# Patient Record
Sex: Male | Born: 1965 | Race: Black or African American | Hispanic: No | Marital: Married | Smoking: Current every day smoker
Health system: Southern US, Community
[De-identification: ages and names within clinical notes are randomized; demographics above are authoritative.]

## PROBLEM LIST (undated history)

## (undated) DIAGNOSIS — E7800 Pure hypercholesterolemia, unspecified: Secondary | ICD-10-CM

## (undated) DIAGNOSIS — E119 Type 2 diabetes mellitus without complications: Secondary | ICD-10-CM

## (undated) DIAGNOSIS — F319 Bipolar disorder, unspecified: Secondary | ICD-10-CM

## (undated) DIAGNOSIS — I10 Essential (primary) hypertension: Secondary | ICD-10-CM

## (undated) DIAGNOSIS — F32A Depression, unspecified: Secondary | ICD-10-CM

## (undated) HISTORY — PX: TOTAL HIP ARTHROPLASTY: SHX124

## (undated) HISTORY — DX: Essential (primary) hypertension: I10

## (undated) HISTORY — DX: Depression, unspecified: F32.A

## (undated) HISTORY — DX: Type 2 diabetes mellitus without complications: E11.9

---

## 2012-09-04 ENCOUNTER — Encounter (HOSPITAL_COMMUNITY): Payer: Self-pay | Admitting: Emergency Medicine

## 2012-09-04 ENCOUNTER — Emergency Department (INDEPENDENT_AMBULATORY_CARE_PROVIDER_SITE_OTHER)
Admission: EM | Admit: 2012-09-04 | Discharge: 2012-09-04 | Disposition: A | Discharge status: Home / Self Care | Payer: Self-pay | Source: Home / Self Care | Attending: Family Medicine | Admitting: Family Medicine

## 2012-09-04 DIAGNOSIS — Z760 Encounter for issue of repeat prescription: Secondary | ICD-10-CM

## 2012-09-04 DIAGNOSIS — E119 Type 2 diabetes mellitus without complications: Secondary | ICD-10-CM

## 2012-09-04 LAB — POCT I-STAT, CHEM 8
BUN: 9 mg/dL (ref 6–23)
Chloride: 103 mEq/L (ref 96–112)
Creatinine, Ser: 0.8 mg/dL (ref 0.50–1.35)
Glucose, Bld: 272 mg/dL — ABNORMAL HIGH (ref 70–99)
Potassium: 4.1 mEq/L (ref 3.5–5.1)
Sodium: 141 mEq/L (ref 135–145)

## 2012-09-04 MED ORDER — METFORMIN HCL 500 MG PO TABS: 500.0000 mg | ORAL_TABLET | Freq: Two times a day (BID) | ORAL | Status: DC

## 2012-09-04 MED ORDER — INSULIN LISPRO 100 UNIT/ML ~~LOC~~ SOLN: SUBCUTANEOUS | Status: DC

## 2012-09-04 MED ORDER — INSULIN GLARGINE 100 UNIT/ML ~~LOC~~ SOLN: 30.0000 [IU] | Freq: Every day | SUBCUTANEOUS | Status: DC

## 2012-09-04 NOTE — ED Notes (Signed)
Patient is here medication refill, Humalog and lantus.

## 2012-09-04 NOTE — ED Provider Notes (Signed)
History    CSN: [id number] Arrival date & time 09/04/12  1029  First MD Initiated Contact with Patient 09/04/12 1103     Chief Complaint  Patient presents with  . Medication Refill   (Consider location/radiation/quality/duration/timing/severity/associated sxs/prior Treatment) HPI Comments: 47 year old male diagnosed with type 2 diabetes 5 years ago. Comments requesting refills on his Lantus and Humalog insulin. Patient states he has been off his medications for 2 months. He reports increased thirst and polydipsia and polyuria. Otherwise denies current dizziness, chest pain, abdominal pain, nausea vomiting or diarrhea. Patient stated he used to live in [place] and had enough refills of his diabetes medications 1-2 months. He does not have insurance and does not have a primary care provider in [place] where he has lived for the past year. Denies foot ulcers or skin breaks or infections.  History reviewed. No pertinent past medical history. No past surgical history on file. No family history on file. History  Substance Use Topics  . Smoking status: Not on file  . Smokeless tobacco: Not on file  . Alcohol Use: Not on file    Review of Systems  Constitutional: Negative for fever, chills, diaphoresis, activity change, appetite change and fatigue.  HENT: Negative for sore throat and trouble swallowing.   Gastrointestinal: Negative for nausea, vomiting, abdominal pain and diarrhea.  Endocrine: Positive for polydipsia and polyuria. Negative for cold intolerance, heat intolerance and polyphagia.  Genitourinary: Negative for dysuria, frequency and flank pain.  Skin: Negative for wound.  Neurological: Negative for dizziness and headaches.  All other systems reviewed and are negative.    Allergies  Review of patient's allergies indicates no known allergies.  Home Medications   Current Outpatient Rx  Name  Route  Sig  Dispense  Refill  . insulin glargine (LANTUS) 100 UNIT/ML  injection   Subcutaneous   Inject 30 Units into the skin at bedtime.         . insulin lispro (HUMALOG) 100 UNIT/ML injection   Subcutaneous   Inject into the skin 3 (three) times daily before meals.          BP 129/93  Pulse 70  Temp(Src) 98.2 F (36.8 C) (Oral)  Resp 18  SpO2 97% Physical Exam  Nursing note and vitals reviewed. Constitutional: He is oriented to person, place, and time. He appears well-developed and well-nourished. No distress.  HENT:  Head: Normocephalic and atraumatic.  Mouth/Throat: Oropharynx is clear and moist. No oropharyngeal exudate.  Eyes: Conjunctivae are normal. Right eye exhibits no discharge. Left eye exhibits no discharge. No scleral icterus.  Neck: Neck supple. No JVD present. No thyromegaly present.  Cardiovascular: Normal heart sounds.   Pulmonary/Chest: Breath sounds normal.  Abdominal: Soft. Bowel sounds are normal. He exhibits no distension and no mass. There is no tenderness. There is no rebound and no guarding.  Lymphadenopathy:    He has no cervical adenopathy.  Neurological: He is alert and oriented to person, place, and time.  Skin: No rash noted. He is not diaphoretic.  Psychiatric: He has a normal mood and affect. His behavior is normal. Judgment and thought content normal.    ED Course  Procedures (including critical care time) Labs Reviewed  POCT I-STAT, CHEM 8 - Abnormal; Notable for the following:    Glucose, Bld 272 (*)    All other components within normal limits   No results found. 1. Diabetes   2. Medication refill     MDM  Point-of-care CB[facility] to 272 here. Patient  vital signs stable and he is is asymptomatic. Refilled Lantus and Humalog. Also prescribed metformin. Provided contact information for: [facility] and [employer] and other community resources to establish primary care. Supportive care and red flags that should prompt his return to medical attention discussed with patient and provided in  writing.  Sharin Grave, MD 09/06/12 [phone]

## 2013-01-26 ENCOUNTER — Emergency Department (HOSPITAL_COMMUNITY)
Admission: EM | Admit: 2013-01-26 | Discharge: 2013-01-26 | Disposition: A | Discharge status: Home / Self Care | Payer: Medicaid Other | Attending: Emergency Medicine | Admitting: Emergency Medicine

## 2013-01-26 ENCOUNTER — Encounter (HOSPITAL_COMMUNITY): Payer: Self-pay | Admitting: Emergency Medicine

## 2013-01-26 DIAGNOSIS — F1410 Cocaine abuse, uncomplicated: Secondary | ICD-10-CM | POA: Insufficient documentation

## 2013-01-26 DIAGNOSIS — F17200 Nicotine dependence, unspecified, uncomplicated: Secondary | ICD-10-CM | POA: Insufficient documentation

## 2013-01-26 DIAGNOSIS — R079 Chest pain, unspecified: Secondary | ICD-10-CM | POA: Insufficient documentation

## 2013-01-26 DIAGNOSIS — Z91199 Patient's noncompliance with other medical treatment and regimen due to unspecified reason: Secondary | ICD-10-CM | POA: Insufficient documentation

## 2013-01-26 DIAGNOSIS — F1910 Other psychoactive substance abuse, uncomplicated: Secondary | ICD-10-CM

## 2013-01-26 DIAGNOSIS — Z8659 Personal history of other mental and behavioral disorders: Secondary | ICD-10-CM | POA: Insufficient documentation

## 2013-01-26 DIAGNOSIS — F1010 Alcohol abuse, uncomplicated: Secondary | ICD-10-CM | POA: Insufficient documentation

## 2013-01-26 DIAGNOSIS — Z79899 Other long term (current) drug therapy: Secondary | ICD-10-CM | POA: Insufficient documentation

## 2013-01-26 DIAGNOSIS — E119 Type 2 diabetes mellitus without complications: Secondary | ICD-10-CM | POA: Insufficient documentation

## 2013-01-26 DIAGNOSIS — Z9119 Patient's noncompliance with other medical treatment and regimen: Secondary | ICD-10-CM | POA: Insufficient documentation

## 2013-01-26 DIAGNOSIS — Z9114 Patient's other noncompliance with medication regimen: Secondary | ICD-10-CM

## 2013-01-26 HISTORY — DX: Type 2 diabetes mellitus without complications: E11.9

## 2013-01-26 HISTORY — DX: Bipolar disorder, unspecified: F31.9

## 2013-01-26 HISTORY — DX: Pure hypercholesterolemia, unspecified: E78.00

## 2013-01-26 LAB — SALICYLATE LEVEL: Salicylate Lvl: <2 mg/dL — ABNORMAL LOW (ref 2.8–20.0)

## 2013-01-26 LAB — COMPREHENSIVE METABOLIC PANEL
ALT: 97 U/L — ABNORMAL HIGH (ref 0–53)
AST: 64 U/L — ABNORMAL HIGH (ref 0–37)
CO2: 22 mEq/L (ref 19–32)
Calcium: 9.1 mg/dL (ref 8.4–10.5)
Chloride: 99 mEq/L (ref 96–112)
Creatinine, Ser: 0.72 mg/dL (ref 0.50–1.35)
GFR calc Af Amer: >90 mL/min (ref >90)
GFR calc non Af Amer: >90 mL/min (ref >90)
Glucose, Bld: 149 mg/dL — ABNORMAL HIGH (ref 70–99)
Total Bilirubin: 0.7 mg/dL (ref 0.3–1.2)

## 2013-01-26 LAB — CBC
HCT: 44.2 % (ref 39.0–52.0)
Hemoglobin: 16 g/dL (ref 13.0–17.0)
MCH: 31.9 pg (ref 26.0–34.0)
MCV: 88.2 fL (ref 78.0–100.0)
RBC: 5.01 MIL/uL (ref 4.22–5.81)
WBC: 12 10*3/uL — ABNORMAL HIGH (ref 4.0–10.5)

## 2013-01-26 LAB — RAPID URINE DRUG SCREEN, HOSP PERFORMED
Amphetamines: NOT DETECTED
Opiates: NOT DETECTED
Tetrahydrocannabinol: NOT DETECTED

## 2013-01-26 MED ORDER — METFORMIN HCL 500 MG PO TABS: 500.0000 mg | ORAL_TABLET | Freq: Two times a day (BID) | ORAL | Status: DC

## 2013-01-26 MED ORDER — LISINOPRIL-HYDROCHLOROTHIAZIDE 10-12.5 MG PO TABS: 1.0000 | ORAL_TABLET | Freq: Every day | ORAL | Status: DC

## 2013-01-26 NOTE — ED Provider Notes (Signed)
CSN: [id number]     Arrival date & time 01/26/13  1702 History   First MD Initiated Contact with Patient 01/26/13 1940     Chief Complaint  Patient presents with  . Medical Clearance   (Consider location/radiation/quality/duration/timing/severity/associated sxs/prior Treatment) The history is provided by the patient.   patient presents to the ED after relapsing on cocaine and alcohol. He had been clean for 6 years. For the last 2 days is used around $600 of cocaine and drank 2 cases of beer. He states his mouth feels dry he feels somewhat bad all over. No chest pain, although he states he has had occasional episodes of chest pain over the last few months. No fevers. No cough. No nausea or diarrhea. No suicidal or homicidal thoughts. He states his been off his medications for a couple months due to financial issues.  Past Medical History  Diagnosis Date  . Diabetes mellitus without complication   . Hypercholesteremia   . Manic depression    Past Surgical History  Procedure Laterality Date  . Total hip arthroplasty     History reviewed. No pertinent family history. History  Substance Use Topics  . Smoking status: Current Every Day Smoker    Types: Cigarettes  . Smokeless tobacco: Not on file  . Alcohol Use: Yes    Review of Systems  Constitutional: Negative for activity change and appetite change.  Eyes: Negative for pain.  Respiratory: Negative for chest tightness and shortness of breath.   Cardiovascular: Positive for chest pain. Negative for leg swelling.  Gastrointestinal: Negative for nausea, vomiting, abdominal pain and diarrhea.  Genitourinary: Negative for flank pain.  Musculoskeletal: Negative for back pain and neck stiffness.  Skin: Negative for rash.  Neurological: Negative for weakness, numbness and headaches.  Psychiatric/Behavioral: Negative for behavioral problems.    Allergies  Review of patient's allergies indicates no known allergies.  Home Medications    Current Outpatient Rx  Name  Route  Sig  Dispense  Refill  . metFORMIN (GLUCOPHAGE) 500 MG tablet   Oral   Take 500 mg by mouth 2 (two) times daily with a meal.         . lisinopril-hydrochlorothiazide (PRINZIDE,ZESTORETIC) 10-12.5 MG per tablet   Oral   Take 1 tablet by mouth daily.   30 tablet   0   . metFORMIN (GLUCOPHAGE) 500 MG tablet   Oral   Take 1 tablet (500 mg total) by mouth 2 (two) times daily with a meal.   60 tablet   0    BP 149/105  Pulse 88  Temp(Src) 98.6 F (37 C) (Oral)  Resp 13  Wt 249 lb 12.8 oz (113.309 kg)  SpO2 98% Physical Exam  Nursing note and vitals reviewed. Constitutional: He is oriented to person, place, and time. He appears well-developed and well-nourished.  HENT:  Head: Normocephalic and atraumatic.  Eyes: EOM are normal. Pupils are equal, round, and reactive to light.  Neck: Normal range of motion. Neck supple.  Cardiovascular: Normal rate, regular rhythm and normal heart sounds.   No murmur heard. Pulmonary/Chest: Effort normal and breath sounds normal.  Abdominal: Soft. Bowel sounds are normal. He exhibits no distension and no mass. There is no tenderness. There is no rebound and no guarding.  Musculoskeletal: Normal range of motion. He exhibits no edema.  Neurological: He is alert and oriented to person, place, and time. No cranial nerve deficit.  Skin: Skin is warm and dry.  Psychiatric: He has a normal mood and  affect.    ED Course  Procedures (including critical care time) Labs Review Labs Reviewed  CBC - Abnormal; Notable for the following:    WBC 12.0 (*)    MCHC 36.2 (*)    All other components within normal limits  COMPREHENSIVE METABOLIC PANEL - Abnormal; Notable for the following:    Sodium 134 (*)    Glucose, Bld 149 (*)    Total Protein 8.4 (*)    AST 64 (*)    ALT 97 (*)    All other components within normal limits  SALICYLATE LEVEL - Abnormal; Notable for the following:    Salicylate Lvl <2.0 (*)     All other components within normal limits  URINE RAPID DRUG SCREEN (HOSP PERFORMED) - Abnormal; Notable for the following:    Cocaine POSITIVE (*)    All other components within normal limits  ACETAMINOPHEN LEVEL  ETHANOL   Imaging Review No results found.  EKG Interpretation    Date/Time:  Sunday January 26 2013 20:19:17 EST Ventricular Rate:  85 PR Interval:  160 QRS Duration: 74 QT Interval:  364 QTC Calculation: 433 R Axis:   41 Text Interpretation:  Sinus rhythm Abnormal R-wave progression, early transition Confirmed by Ladon Vandenberghe  MD, Emmajo Bennette ([id number]) on 01/26/2013 8:47:38 PM            MDM   1. Substance abuse   2. Non compliance w medication regimen    Patient with substance abuse. He has only been using for 2 days. He is not appear to need inpatient treatment at this time. He'll be given resources for outpatient followup. He also does not have a primary care Dr. Clista Bernhardt given followup  information for [place] and wellness. he was given prescriptions for medications.   Juliet Rude. Rubin Payor, MD 01/26/13 2050

## 2013-01-26 NOTE — ED Notes (Signed)
Pt states he spent $500 worth in cocaine and drank about 24 beers in the last 24 hours. Pt states he has not being complaint with insulin. He has felt tightness in right side jaw and frequent sharp pains in chest but it went away. Pt states he has not had insulin in close to one year ago because he can not afford it. Pt states he was given metformin about 3 weeks ago.

## 2013-01-26 NOTE — ED Notes (Signed)
Pt states he is here because he used alcohol and cocaine for the first time in 6 years last night. States he has been off his daily meds for 3 months. States he feels dehydrated. Denies si/hi.

## 2013-01-26 NOTE — ED Notes (Signed)
Md at bedside

## 2013-04-20 ENCOUNTER — Encounter (HOSPITAL_COMMUNITY): Payer: Self-pay | Admitting: Emergency Medicine

## 2013-04-20 ENCOUNTER — Inpatient Hospital Stay (HOSPITAL_COMMUNITY)
Admission: AD | Admit: 2013-04-20 | Discharge: 2013-04-28 | DRG: 897 | Disposition: A | Discharge status: Other Acute Inpatient Hospital | Payer: Medicaid Other | Source: Intra-hospital | Attending: Psychiatry | Admitting: Psychiatry

## 2013-04-20 ENCOUNTER — Emergency Department (HOSPITAL_COMMUNITY)
Admission: EM | Admit: 2013-04-20 | Discharge: 2013-04-20 | Disposition: A | Discharge status: Other Acute Inpatient Hospital | Payer: Medicaid Other | Attending: Emergency Medicine | Admitting: Emergency Medicine

## 2013-04-20 ENCOUNTER — Encounter (HOSPITAL_COMMUNITY): Payer: Self-pay

## 2013-04-20 DIAGNOSIS — F32A Depression, unspecified: Secondary | ICD-10-CM

## 2013-04-20 DIAGNOSIS — F1420 Cocaine dependence, uncomplicated: Secondary | ICD-10-CM | POA: Diagnosis present

## 2013-04-20 DIAGNOSIS — IMO0002 Reserved for concepts with insufficient information to code with codable children: Secondary | ICD-10-CM | POA: Diagnosis present

## 2013-04-20 DIAGNOSIS — E118 Type 2 diabetes mellitus with unspecified complications: Secondary | ICD-10-CM

## 2013-04-20 DIAGNOSIS — I10 Essential (primary) hypertension: Secondary | ICD-10-CM | POA: Diagnosis present

## 2013-04-20 DIAGNOSIS — F319 Bipolar disorder, unspecified: Secondary | ICD-10-CM | POA: Insufficient documentation

## 2013-04-20 DIAGNOSIS — F1020 Alcohol dependence, uncomplicated: Principal | ICD-10-CM | POA: Diagnosis present

## 2013-04-20 DIAGNOSIS — F1410 Cocaine abuse, uncomplicated: Secondary | ICD-10-CM | POA: Insufficient documentation

## 2013-04-20 DIAGNOSIS — F329 Major depressive disorder, single episode, unspecified: Secondary | ICD-10-CM

## 2013-04-20 DIAGNOSIS — F1010 Alcohol abuse, uncomplicated: Secondary | ICD-10-CM

## 2013-04-20 DIAGNOSIS — E7800 Pure hypercholesterolemia, unspecified: Secondary | ICD-10-CM | POA: Insufficient documentation

## 2013-04-20 DIAGNOSIS — F17200 Nicotine dependence, unspecified, uncomplicated: Secondary | ICD-10-CM | POA: Insufficient documentation

## 2013-04-20 DIAGNOSIS — E1165 Type 2 diabetes mellitus with hyperglycemia: Secondary | ICD-10-CM | POA: Diagnosis present

## 2013-04-20 DIAGNOSIS — F1994 Other psychoactive substance use, unspecified with psychoactive substance-induced mood disorder: Secondary | ICD-10-CM | POA: Diagnosis present

## 2013-04-20 DIAGNOSIS — E119 Type 2 diabetes mellitus without complications: Secondary | ICD-10-CM | POA: Insufficient documentation

## 2013-04-20 HISTORY — DX: Essential (primary) hypertension: I10

## 2013-04-20 LAB — COMPREHENSIVE METABOLIC PANEL
ALT: 56 U/L — AB (ref 0–53)
AST: 41 U/L — AB (ref 0–37)
Albumin: 3.4 g/dL — ABNORMAL LOW (ref 3.5–5.2)
Alkaline Phosphatase: 88 U/L (ref 39–117)
BILIRUBIN TOTAL: 0.5 mg/dL (ref 0.3–1.2)
BUN: 9 mg/dL (ref 6–23)
CHLORIDE: 98 meq/L (ref 96–112)
CO2: 23 meq/L (ref 19–32)
Calcium: 8.8 mg/dL (ref 8.4–10.5)
Creatinine, Ser: 0.76 mg/dL (ref 0.50–1.35)
GFR calc Af Amer: >90 mL/min (ref >90)
Glucose, Bld: 258 mg/dL — ABNORMAL HIGH (ref 70–99)
POTASSIUM: 3.7 meq/L (ref 3.7–5.3)
SODIUM: 135 meq/L — AB (ref 137–147)
Total Protein: 7.8 g/dL (ref 6.0–8.3)

## 2013-04-20 LAB — CBC WITH DIFFERENTIAL/PLATELET
BASOS ABS: 0 10*3/uL (ref 0.0–0.1)
Basophils Relative: 0 % (ref 0–1)
Eosinophils Absolute: 0.1 10*3/uL (ref 0.0–0.7)
Eosinophils Relative: 0 % (ref 0–5)
HEMATOCRIT: 43.4 % (ref 39.0–52.0)
Hemoglobin: 15.7 g/dL (ref 13.0–17.0)
LYMPHS PCT: 46 % (ref 12–46)
Lymphs Abs: 5.3 10*3/uL — ABNORMAL HIGH (ref 0.7–4.0)
MCH: 32.8 pg (ref 26.0–34.0)
MCHC: 36.2 g/dL — ABNORMAL HIGH (ref 30.0–36.0)
MCV: 90.8 fL (ref 78.0–100.0)
Monocytes Absolute: 0.8 10*3/uL (ref 0.1–1.0)
Monocytes Relative: 7 % (ref 3–12)
NEUTROS ABS: 5.4 10*3/uL (ref 1.7–7.7)
NEUTROS PCT: 47 % (ref 43–77)
PLATELETS: 194 10*3/uL (ref 150–400)
RBC: 4.78 MIL/uL (ref 4.22–5.81)
RDW: 13.6 % (ref 11.5–15.5)
WBC: 11.6 10*3/uL — AB (ref 4.0–10.5)

## 2013-04-20 LAB — RAPID URINE DRUG SCREEN, HOSP PERFORMED
AMPHETAMINES: NOT DETECTED
BARBITURATES: NOT DETECTED
BENZODIAZEPINES: NOT DETECTED
Cocaine: POSITIVE — AB
Opiates: NOT DETECTED
Tetrahydrocannabinol: NOT DETECTED

## 2013-04-20 LAB — GLUCOSE, CAPILLARY
GLUCOSE-CAPILLARY: 239 mg/dL — AB (ref 70–99)
Glucose-Capillary: 302 mg/dL — ABNORMAL HIGH (ref 70–99)
Glucose-Capillary: 333 mg/dL — ABNORMAL HIGH (ref 70–99)

## 2013-04-20 LAB — ETHANOL: Alcohol, Ethyl (B): <11 mg/dL (ref 0–11)

## 2013-04-20 MED ORDER — CHLORDIAZEPOXIDE HCL 25 MG PO CAPS
25.0000 mg | ORAL_CAPSULE | Freq: Four times a day (QID) | ORAL | Status: AC
  Administered 2013-04-20 (×3): 25 mg via ORAL
  Filled 2013-04-20 (×3): qty 1

## 2013-04-20 MED ORDER — ACETAMINOPHEN 325 MG PO TABS: 650.0000 mg | ORAL_TABLET | Freq: Four times a day (QID) | ORAL | Status: DC | PRN

## 2013-04-20 MED ORDER — NICOTINE 21 MG/24HR TD PT24
21.0000 mg | MEDICATED_PATCH | Freq: Every day | TRANSDERMAL | Status: DC
  Administered 2013-04-20 – 2013-04-21 (×2): 21 mg via TRANSDERMAL
  Filled 2013-04-20 (×10): qty 1

## 2013-04-20 MED ORDER — INSULIN ASPART 100 UNIT/ML ~~LOC~~ SOLN
4.0000 [IU] | Freq: Three times a day (TID) | SUBCUTANEOUS | Status: DC
  Administered 2013-04-20 – 2013-04-22 (×6): 4 [IU] via SUBCUTANEOUS

## 2013-04-20 MED ORDER — HYDROXYZINE HCL 25 MG PO TABS
25.0000 mg | ORAL_TABLET | Freq: Four times a day (QID) | ORAL | Status: AC | PRN
Start: 2013-04-20 — End: 2013-04-23
  Administered 2013-04-20: 25 mg via ORAL
  Filled 2013-04-20: qty 1

## 2013-04-20 MED ORDER — CHLORDIAZEPOXIDE HCL 25 MG PO CAPS
25.0000 mg | ORAL_CAPSULE | Freq: Every day | ORAL | Status: AC
  Administered 2013-04-23: 25 mg via ORAL
  Filled 2013-04-20: qty 1

## 2013-04-20 MED ORDER — IBUPROFEN 200 MG PO TABS: 600.0000 mg | ORAL_TABLET | Freq: Three times a day (TID) | ORAL | Status: DC | PRN

## 2013-04-20 MED ORDER — LORAZEPAM 1 MG PO TABS: 1.0000 mg | ORAL_TABLET | Freq: Three times a day (TID) | ORAL | Status: DC | PRN

## 2013-04-20 MED ORDER — LISINOPRIL 5 MG PO TABS
5.0000 mg | ORAL_TABLET | Freq: Every day | ORAL | Status: DC
  Administered 2013-04-20 – 2013-04-27 (×8): 5 mg via ORAL
  Filled 2013-04-20 (×2): qty 1
  Filled 2013-04-20: qty 14
  Filled 2013-04-20: qty 1
  Filled 2013-04-20 (×3): qty 14
  Filled 2013-04-20 (×3): qty 1
  Filled 2013-04-20: qty 14
  Filled 2013-04-20 (×3): qty 1

## 2013-04-20 MED ORDER — MAGNESIUM HYDROXIDE 400 MG/5ML PO SUSP: 30.0000 mL | Freq: Every day | ORAL | Status: DC | PRN

## 2013-04-20 MED ORDER — THIAMINE HCL 100 MG/ML IJ SOLN
100.0000 mg | Freq: Once | INTRAMUSCULAR | Status: AC
  Administered 2013-04-20: 100 mg via INTRAMUSCULAR
  Filled 2013-04-20: qty 2

## 2013-04-20 MED ORDER — LOPERAMIDE HCL 2 MG PO CAPS: 2.0000 mg | ORAL_CAPSULE | ORAL | Status: AC | PRN

## 2013-04-20 MED ORDER — ZOLPIDEM TARTRATE 5 MG PO TABS: 5.0000 mg | ORAL_TABLET | Freq: Every evening | ORAL | Status: DC | PRN

## 2013-04-20 MED ORDER — ONDANSETRON 4 MG PO TBDP: 4.0000 mg | ORAL_TABLET | Freq: Four times a day (QID) | ORAL | Status: AC | PRN

## 2013-04-20 MED ORDER — ADULT MULTIVITAMIN W/MINERALS CH
1.0000 | ORAL_TABLET | Freq: Every day | ORAL | Status: DC
  Administered 2013-04-20 – 2013-04-27 (×8): 1 via ORAL
  Filled 2013-04-20 (×14): qty 1

## 2013-04-20 MED ORDER — ONDANSETRON HCL 4 MG PO TABS
4.0000 mg | ORAL_TABLET | Freq: Three times a day (TID) | ORAL | Status: DC | PRN
Start: 2013-04-20 — End: 2013-04-20

## 2013-04-20 MED ORDER — TRAZODONE HCL 50 MG PO TABS
50.0000 mg | ORAL_TABLET | Freq: Every evening | ORAL | Status: DC | PRN
  Administered 2013-04-20 – 2013-04-27 (×5): 50 mg via ORAL
  Filled 2013-04-20 (×2): qty 1
  Filled 2013-04-20: qty 14

## 2013-04-20 MED ORDER — ALUM & MAG HYDROXIDE-SIMETH 200-200-20 MG/5ML PO SUSP: 30.0000 mL | ORAL | Status: DC | PRN

## 2013-04-20 MED ORDER — INSULIN ASPART 100 UNIT/ML ~~LOC~~ SOLN
0.0000 [IU] | Freq: Three times a day (TID) | SUBCUTANEOUS | Status: DC
  Administered 2013-04-20 (×2): 11 [IU] via SUBCUTANEOUS
  Administered 2013-04-21: 15 [IU] via SUBCUTANEOUS
  Administered 2013-04-21: 8 [IU] via SUBCUTANEOUS
  Administered 2013-04-22: 15 [IU] via SUBCUTANEOUS
  Administered 2013-04-22: 11 [IU] via SUBCUTANEOUS
  Administered 2013-04-22: 8 [IU] via SUBCUTANEOUS
  Administered 2013-04-23: 15 [IU] via SUBCUTANEOUS
  Administered 2013-04-23: 8 [IU] via SUBCUTANEOUS
  Administered 2013-04-23: 11 [IU] via SUBCUTANEOUS
  Administered 2013-04-24: 15 [IU] via SUBCUTANEOUS
  Administered 2013-04-24: 11 [IU] via SUBCUTANEOUS
  Administered 2013-04-24: 8 [IU] via SUBCUTANEOUS
  Administered 2013-04-25 (×2): 15 [IU] via SUBCUTANEOUS
  Administered 2013-04-25 – 2013-04-26 (×3): 5 [IU] via SUBCUTANEOUS

## 2013-04-20 MED ORDER — CHLORDIAZEPOXIDE HCL 25 MG PO CAPS
25.0000 mg | ORAL_CAPSULE | Freq: Four times a day (QID) | ORAL | Status: AC | PRN
  Filled 2013-04-20: qty 1

## 2013-04-20 MED ORDER — NICOTINE 21 MG/24HR TD PT24: 21.0000 mg | MEDICATED_PATCH | Freq: Every day | TRANSDERMAL | Status: DC

## 2013-04-20 MED ORDER — CHLORDIAZEPOXIDE HCL 25 MG PO CAPS
25.0000 mg | ORAL_CAPSULE | Freq: Three times a day (TID) | ORAL | Status: AC
  Administered 2013-04-21 (×3): 25 mg via ORAL
  Filled 2013-04-20 (×3): qty 1

## 2013-04-20 MED ORDER — VITAMIN B-1 100 MG PO TABS
100.0000 mg | ORAL_TABLET | Freq: Every day | ORAL | Status: DC
  Administered 2013-04-21 – 2013-04-27 (×7): 100 mg via ORAL
  Filled 2013-04-20 (×12): qty 1

## 2013-04-20 MED ORDER — CHLORDIAZEPOXIDE HCL 25 MG PO CAPS
25.0000 mg | ORAL_CAPSULE | ORAL | Status: AC
  Administered 2013-04-22 (×2): 25 mg via ORAL
  Filled 2013-04-20 (×2): qty 1

## 2013-04-20 NOTE — BH Assessment (Signed)
Clinician consulted with Alberteen SamFran Hobson NP who states Pt meets criteria for inpatient detox upon medical clearance. Drenda FreezeFran reported patient's diastolic BP needs to be lower than 90 [facility]  Joann, AC [facility] Pt assigned to bed 303[facility]2 to Dr. Dub MikesLugo. Clinician contacted Theron AristaPeter, PA to provide recommendation. Theron Aristaeter reported they would check pt's blood pressure again.   Yaakov Guthrieelilah Stewart, MSW, LCSW Triage Specialist [phone]

## 2013-04-20 NOTE — BH Assessment (Signed)
Clinician contacted Stephen AristaPeter, [place] working with Stephen Guzman to gather report prior to assessment. Stephen Aristaeter reported Stephen Guzman was seeking detox for alcohol and cocaine but alcohol amounts are undetected.  Clinician reported UDS was not back at this time to detect cocaine amounts.  Clinician will initiate tele-assessment once machine is available; tele-assessment in use by other clinician at this time.   Stephen Guzman, MSW, LCSW Triage Specialist [phone]

## 2013-04-20 NOTE — ED Notes (Signed)
Pt being placed in blue scrubs.

## 2013-04-20 NOTE — [facility] Group Notes (Signed)
[facility] Group Notes:  (Clinical Social Work)  04/20/2013  10:00-11:00AM  Summary of Progress/Problems:   The main focus of today's process group was to   identify the patient's current support system and decide on other supports that can be put in place.  The picture on workbook was used to discuss why additional supports are needed.  An emphasis was placed on using counselor, doctor, therapy groups, 12-step groups, and problem-specific support groups to expand supports.   There was also an extensive discussion about what constitutes a healthy support versus an unhealthy support.  The patient expressed full comprehension of the concepts presented, and agreed that there is a need to add more supports.  The patient stated he did not really want to discuss his current supports, was just arriving at the hospital and wanted to only listen.  Later, he did give feedback to another group member.  Type of Therapy:  Process Group with Motivational Interviewing  Participation Level:  Active  Participation Quality:  Attentive and Supportive  Affect:  Appropriate  Cognitive:  Appropriate  Insight:  Engaged  Engagement in Therapy:  Engaged  Modes of Intervention:   Education, Support and Processing, Activity  Pilgrim's PrideMareida Grossman-Orr, LCSW 04/20/2013, 12:15pm

## 2013-04-20 NOTE — [facility] Suicide Risk Assessment (Signed)
Suicide Risk Assessment  Admission Assessment     Nursing information obtained from:    Demographic factors:    Current Mental Status:    Loss Factors:    Historical Factors:    Risk Reduction Factors:    Total Time spent with patient: 45 minutes  CLINICAL FACTORS:   Depression:   Comorbid alcohol abuse/dependence Impulsivity Insomnia Alcohol/Substance Abuse/Dependencies  COGNITIVE FEATURES THAT CONTRIBUTE TO RISK:  Closed-mindedness Polarized thinking Thought constriction (tunnel vision)    SUICIDE RISK:   Moderate:  Frequent suicidal ideation with limited intensity, and duration, some specificity in terms of plans, no associated intent, good self-control, limited dysphoria/symptomatology, some risk factors present, and identifiable protective factors, including available and accessible social support.  PLAN OF CARE: Supportive approach/coping skills/relapse prevention                               [facility] detox protocol/reassess and address the co morbidities  I certify that inpatient services furnished can reasonably be expected to improve the patient's condition.  Abagail Limb A 04/20/2013, 4:18 PM

## 2013-04-20 NOTE — ED Provider Notes (Signed)
CSN: [id number]     Arrival date & time 04/20/13  0203 History   First MD Initiated Contact with Patient 04/20/13 [phone]     Chief Complaint  Patient presents with  . Alcohol Problem  . Drug Problem   HPI  History provided by the patient. Patient is a 48 year old male with history of hypertension, diabetes, depression polys substance abuse who presents with request for help with alcohol and cocaine detox. Patient states that he has been on cocaine binge for the past one month or more. States he has been smoking crack cocaine normals daily. He also increases his alcohol use while he smokes cocaine. States he may drink up to 24 beers in a day. He reports having a long history of drug and alcohol use.he states he generally is not a regular user but will have significant binges. He did use cocaine and alcohol yesterday. He reports some increased depression but denies any suicidal ideation. No HI. Denies any other aggravating or alleviating factors. Denies any associated chest pain or shortness of breath. No recent illnesses.   Past Medical History  Diagnosis Date  . Diabetes mellitus without complication   . Hypercholesteremia   . Manic depression    Past Surgical History  Procedure Laterality Date  . Total hip arthroplasty     No family history on file. History  Substance Use Topics  . Smoking status: Current Every Day Smoker    Types: Cigarettes  . Smokeless tobacco: Not on file  . Alcohol Use: Yes    Review of Systems  Constitutional: Negative for fever and chills.  Respiratory: Negative for cough.   Cardiovascular: Negative for chest pain.  All other systems reviewed and are negative.      Allergies  Review of patient's allergies indicates no known allergies.  Home Medications  No current outpatient prescriptions on file. BP 160/109  Pulse 80  Temp(Src) 97.8 F (36.6 C) (Oral)  Resp 18  Wt 245 lb (111.131 kg)  SpO2 97% Physical Exam  Nursing note and vitals  reviewed. Constitutional: He is oriented to person, place, and time. He appears well-developed and well-nourished. No distress.  HENT:  Head: Normocephalic.  Cardiovascular: Normal rate and regular rhythm.   Pulmonary/Chest: Effort normal and breath sounds normal. No respiratory distress. He has no wheezes. He has no rales.  Abdominal: Soft. There is no tenderness.  Neurological: He is alert and oriented to person, place, and time.  Skin: Skin is warm.  Psychiatric: He has a normal mood and affect. His behavior is normal.    ED Course  Procedures   DIAGNOSTIC STUDIES: Oxygen Saturation is 97% on room air.    COORDINATION OF CARE:  Nursing notes reviewed. Vital signs reviewed. Initial pt interview and examination performed.   3:48 AM-patient seen and evaluated. Patient resting comfortably appears well no acute distress. Discussed work up plan with pt at bedside, which includes medical screening in TTS consult. Pt agrees with plan.   TTS consult placed. Psychiatric holding orders in place.   [facility]  Results for orders placed during the hospital encounter of 04/20/13  ETHANOL      Result Value Ref Range   Alcohol, Ethyl (B) <11  0 - 11 mg/dL  URINE RAPID DRUG SCREEN (HOSP PERFORMED)      Result Value Ref Range   Opiates NONE DETECTED  NONE DETECTED   Cocaine POSITIVE (*) NONE DETECTED   Benzodiazepines NONE DETECTED  NONE DETECTED  Amphetamines NONE DETECTED  NONE DETECTED   Tetrahydrocannabinol NONE DETECTED  NONE DETECTED   Barbiturates NONE DETECTED  NONE DETECTED  CBC WITH DIFFERENTIAL      Result Value Ref Range   WBC 11.6 (*) 4.0 - 10.5 K/uL   RBC 4.78  4.22 - 5.81 MIL/uL   Hemoglobin 15.7  13.0 - 17.0 g/dL   HCT 16.1  09.6 - 04.5 %   MCV 90.8  78.0 - 100.0 fL   MCH 32.8  26.0 - 34.0 pg   MCHC 36.2 (*) 30.0 - 36.0 g/dL   RDW 40.9  81.1 - 91.4 %   Platelets 194  150 - 400 K/uL   Neutrophils Relative % 47  43 - 77 %    Neutro Abs 5.4  1.7 - 7.7 K/uL   Lymphocytes Relative 46  12 - 46 %   Lymphs Abs 5.3 (*) 0.7 - 4.0 K/uL   Monocytes Relative 7  3 - 12 %   Monocytes Absolute 0.8  0.1 - 1.0 K/uL   Eosinophils Relative 0  0 - 5 %   Eosinophils Absolute 0.1  0.0 - 0.7 K/uL   Basophils Relative 0  0 - 1 %   Basophils Absolute 0.0  0.0 - 0.1 K/uL  COMPREHENSIVE METABOLIC PANEL      Result Value Ref Range   Sodium 135 (*) 137 - 147 mEq/L   Potassium 3.7  3.7 - 5.3 mEq/L   Chloride 98  96 - 112 mEq/L   CO2 23  19 - 32 mEq/L   Glucose, Bld 258 (*) 70 - 99 mg/dL   BUN 9  6 - 23 mg/dL   Creatinine, Ser 7.82  0.50 - 1.35 mg/dL   Calcium 8.8  8.4 - 95.6 mg/dL   Total Protein 7.8  6.0 - 8.3 g/dL   Albumin 3.4 (*) 3.5 - 5.2 g/dL   AST 41 (*) 0 - 37 U/L   ALT 56 (*) 0 - 53 U/L   Alkaline Phosphatase 88  39 - 117 U/L   Total Bilirubin 0.5  0.3 - 1.2 mg/dL   GFR calc non Af Amer >90  >90 mL/min   GFR calc Af Amer >90  >90 mL/min      MDM   Final diagnoses:  Cocaine abuse  Alcohol abuse  Depression       Angus Seller, PA-C 04/20/13 [phone]

## 2013-04-20 NOTE — Progress Notes (Signed)
Adult Psychoeducational Group Note  Date:  04/20/2013 Time:  3:15PM  Group Topic/Focus:  Healthy Support Systems  Participation Level:  Active  Participation Quality:  Appropriate, Attentive and Sharing  Affect:  Appropriate  Cognitive:  Appropriate  Insight: Appropriate  Engagement in Group:  Engaged  Modes of Intervention:  Discussion  Additional Comments:  Pt indicated that his main support is his wife. Pt expressed that he could be a better support to himself by understanding his triggers, which he indicated is having money  Barth KirksSmith, Claus Silvestro R 04/20/2013, 5:44 PM

## 2013-04-20 NOTE — Progress Notes (Signed)
D.  Pt pleasant on approach, denies complaints at this time.  Denied SI/HI/hallucinations at this time.  Minimal withdrawal symptoms noted.  Interacting appropriately with peers on unit.  A.  Support and encouragement offered, medication given as ordered  R.   Pt remains safe on unit, will continue to monitor.

## 2013-04-20 NOTE — BH Assessment (Signed)
Per [place], RN tele-assessment will be initiated at 0500.  Stephen Guzman, MSW, LCSW Triage Specialist [phone]

## 2013-04-20 NOTE — [facility] Group Notes (Signed)
[facility] Group Notes:  (Nursing/MHT/Case Management/Adjunct)  Date:  04/20/2013  Time:  1:51 PM  Type of Therapy:  Nurse Education  Participation Level:  Active  Participation Quality:  Appropriate  Affect:  Appropriate  Cognitive:  Alert  Insight:  Appropriate  Engagement in Group:  Engaged  Modes of Intervention:  Discussion  Summary of Progress/Problems:pt attended the group on healthy support systems and volunteered to read.   Rodman KeyWebb, Tanmay Halteman [facility] 04/20/2013, 1:51 PM

## 2013-04-20 NOTE — H&P (Signed)
Psychiatric Admission Assessment Adult  Patient Identification:  Stephen Guzman Date of Evaluation:  04/20/2013 Chief Complaint:  ETOH USE D/O,SEVERE COCAINE USE D/O,SEVERE MDD History of Present Illness:: 48 Y/O male who states that he "pick up alcohol and cocaine again," 3 months ago. States he has been binging on and off for 9 months. Drinks from 12 to 24 beers, and or liquor, or wine. "Variety drunk". States that the crack cocaine comes together with the alcohol. . States that her sons mother got him for Christmas, and did not bring him back. States he is upset as his son has behavioral issues and the mother drops him whe he is not doing well and when he got to behave better she comes and "snatches" him back. She  lives in [place]. This got him really upset. He is currently unemployed as he lost his job due to his substance use Associated Signs/Synptoms: Depression Symptoms:  depressed mood, anhedonia, insomnia, fatigue, anxiety, loss of energy/fatigue, disturbed sleep, weight loss, (Hypo) Manic Symptoms:  Irritable Mood, Labiality of Mood, Anxiety Symptoms:  Excessive Worry, Psychotic Symptoms:  Denies PTSD Symptoms: Negative Total Time spent with patient: 45 minutes  Psychiatric Specialty Exam: Physical Exam  Review of Systems  Constitutional: Positive for malaise/fatigue.  HENT: Negative.   Eyes: Positive for blurred vision.  Respiratory: Positive for cough.        10 a day  Cardiovascular: Positive for chest pain.  Gastrointestinal: Negative.   Genitourinary: Negative.   Musculoskeletal: Negative.   Skin: Negative.   Neurological: Positive for weakness.  Endo/Heme/Allergies: Negative.   Psychiatric/Behavioral: Positive for depression and substance abuse. The patient is nervous/anxious and has insomnia.     Blood pressure 141/105, pulse 81, temperature 98 F (36.7 C), temperature source Oral, resp. rate 20, height 6' (1.829 m), weight 111.131 kg (245 lb).Body mass  index is 33.22 kg/(m^2).  General Appearance: Fairly Groomed  Patent attorney::  Fair  Speech:  Clear and Coherent  Volume:  fluctuates  Mood:  Anxious, Depressed and worried, irritable  Affect:  anxious, worried  Thought Process:  Coherent and Goal Directed  Orientation:  Full (Time, Place, and Person)  Thought Content:  events, symptoms, worries, concerns, shame and guilt  Suicidal Thoughts:  No  Homicidal Thoughts:  No  Memory:  Immediate;   Fair Recent;   Fair Remote;   Fair  Judgement:  Fair  Insight:  Present and Shallow  Psychomotor Activity:  Restlessness  Concentration:  Fair  Recall:  [employer] of Knowledge:Fair  Language: Fair  Akathisia:  No  Handed:    AIMS (if indicated):     Assets:  Desire for Improvement Housing  Sleep:       Musculoskeletal: Strength & Muscle Tone: within normal limits Gait & Station: normal Patient leans: N/A  Past Psychiatric History: Diagnosis:  Hospitalizations: in [place]  Outpatient Care:  Substance Abuse Care: [facility] months (2008) Has had 9 motnsh of sobriety. Money is a trigger. In [employer].   Self-Mutilation: Denies  Suicidal Attempts:Denies  Violent Behaviors:Denies   Past Medical History:   Past Medical History  Diagnosis Date  . Diabetes mellitus without complication   . Hypercholesteremia   . Manic depression   . Hypertension     Allergies:  No Known Allergies PTA Medications: No prescriptions prior to admission    Previous Psychotropic Medications:  Medication/Dose  Prozac, Seroquel (did not take it)  Substance Abuse History in the last 12 months:  yes  Consequences of Substance Abuse: Legal Consequences:  DWI and other drug related charges Withdrawal Symptoms:   Diaphoresis Tremors  Social History:  reports that he has been smoking Cigarettes.  He has been smoking about 0.50 packs per day. He does not have any smokeless tobacco history on file. He reports that  he drinks about 14.4 ounces of alcohol per week. He reports that he uses illicit drugs (Cocaine). Additional Social History: Pain Medications: none reported Prescriptions: none reported Over the Counter: none reported History of alcohol / drug use?: Yes Longest period of sobriety (when/how long): 9 months in 2006-07 Negative Consequences of Use: Financial;Personal relationships;Work / School Withdrawal Symptoms: Agitation;Fever / Chills;Irritability;Sweats;Tingling Name of Substance 1: Alcohol 1 - Age of First Use: 5 1 - Amount (size/oz): 24 beers 1 - Frequency: 3-4 days a week 1 - Duration: 3 months 1 - Last Use / Amount: 04/20/13 Name of Substance 2: Cocaine 2 - Age of First Use: 16 2 - Amount (size/oz): $200-$300 worth 2 - Duration: 3 months 2 - Last Use / Amount: 04/20/13                Current Place of Residence:  Lives with wife Place of Birth:   Family Members: Marital Status:  Married Children:  Sons: 15 (currently with his biological mother)  Daughters: Relationships: Education:  GED, little college was in prison then the program was suspended Educational Problems/Performance: Religious Beliefs/Practices: History of Abuse (Emotional/Phsycial/Sexual) none Occupational Experiences; 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 Fired from [employer] due to being "High" not Acupuncturistgoing  Military History:  None. Legal History: One DWI Drug related charges, 7 years 10 months  Hobbies/Interests:  Family History:  History reviewed. No pertinent family history.  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 Diabetes mellitus without complication   . Hypercholesteremia   . Manic depression   . Hypertension    AXIS IV:  other psychosocial or environmental problems AXIS V:  41-50 serious symptoms  Treatment Plan/Recommendations:  Supportive approach/coping skills/relapse prevention                                                                 [facility] Detox protocol  Reassess and address the co morbidities  Treatment Plan Summary: Daily contact with patient to assess and evaluate symptoms and progress in treatment Medication management Current Medications:  Current Facility-Administered Medications  Medication Dose Route Frequency Provider Last Rate Last Dose  . acetaminophen (TYLENOL) tablet 650 mg  650 mg Oral Q6H PRN Kristeen Mans, NP      . alum & mag hydroxide-simeth (MAALOX/MYLANTA) 200-200-20 MG/5ML suspension 30 mL  30 mL Oral PRN Kristeen Mans, NP      . insulin aspart (novoLOG) injection 0-15 Units  0-15 Units Subcutaneous TID WC Kristeen Mans, NP   11 Units at 04/20/13 1217  . insulin aspart (novoLOG) injection 4 Units  4 Units Subcutaneous TID WC Kristeen Mans, NP   4 Units at 04/20/13 1217  . magnesium hydroxide (MILK OF MAGNESIA) suspension 30 mL  30 mL Oral Daily PRN Kristeen Mans, NP      . nicotine (NICODERM CQ - dosed in mg/24 hours) patch 21 mg  21 mg Transdermal Daily Kristeen Mans, NP   21 mg at 04/20/13 1125  . traZODone (DESYREL) tablet 50 mg  50 mg  Oral QHS PRN Kristeen Mans, NP        Observation Level/Precautions:  15 minute checks  Laboratory:  As per the ED  Psychotherapy:  Individual/group  Medications:  [facility] detox/reassess and address the co morbidities  Consultations:    Discharge Concerns:    Estimated LOS: 3-5 days  Other:     I certify that inpatient services furnished can reasonably be expected to improve the patient's condition.   Jaclyn Andy A 2/22/201512:44 PM

## 2013-04-20 NOTE — Progress Notes (Signed)
Patient ID: Stephen HartshornDeno Guzman, male   DOB: 15-Sep-1965, 48 y.o.   MRN: [id number] Pt is a 48 yr old african [place] male that came in for etoh and cocaine detox. Last use was this morning around 0130. Pt states he drinks anywhere from 12-24 beers a night and uses  $200-300 worth of cocaine when he uses. Pt states he has been drinking since age 89 and using since [age]. Pt states he is MDD and drinking and drugs has always been his coping skills. Pt is unemployed and his drug use is starting to cause a strain on his marriage. Pt stated he would like to go to a long term program to help get and keep him clean. Pt stated he is tried AA but it did not work out for him. Denies si/hi/avh. Denies pain. Pt is calm and cooperative. Pt remains safe on the unti

## 2013-04-20 NOTE — Tx Team (Signed)
Initial Interdisciplinary Treatment Plan  PATIENT STRENGTHS: (choose at least two) Ability for insight Capable of independent living Communication skills Motivation for treatment/growth Supportive family/friends  PATIENT STRESSORS: Financial difficulties Marital or family conflict Substance abuse   PROBLEM LIST: Problem List/Patient Goals Date to be addressed Date deferred Reason deferred Estimated date of resolution  Substance abuse      depression      Poor coping skills      Enter long term rehab                                     DISCHARGE CRITERIA:  Ability to meet basic life and health needs Adequate post-discharge living arrangements Improved stabilization in mood, thinking, and/or behavior Motivation to continue treatment in a less acute level of care Withdrawal symptoms are absent or subacute and managed without 24-hour nursing intervention  PRELIMINARY DISCHARGE PLAN: Attend aftercare/continuing care group Attend PHP/IOP Attend 12-step recovery group Return to previous living arrangement  PATIENT/FAMIILY INVOLVEMENT: This treatment plan has been presented to and reviewed with the patient, Stephen Guzman.  The patient and family have been given the opportunity to ask questions and make suggestions.  Stephen Guzman, Stephen Guzman 04/20/2013, 9:50 AM

## 2013-04-20 NOTE — Progress Notes (Signed)
Patient did attend the evening speaker AA meeting.  

## 2013-04-20 NOTE — ED Notes (Signed)
Patient has been wanded by security. Belongings at nurse's station.

## 2013-04-20 NOTE — BH Assessment (Signed)
Tele Assessment Note   Stephen HartshornDeno Guzman is an 48 y.o. male who [facility] and [facility].  Pt reports that his son's mother took him from pt's home 3 months ago and that when he began using. Pt reports he recently lost his job as a result of using. Pt reported currently living with his wife but his wife wants him out of the home due to his substance abuse.  Pt reported most recent use of cocaine and alcohol was at around 0130 on 04/20/13.  Pt reported experiencing depression and anxiety symptoms and using substances as a way of coping with issues going on in his life.   Pt was Ox4. Pt was calm and cooperative during assessment. Pt reported depressed and irritable mood and present with depressed affect.  Pt had poor eye contact. Pt's thought process was coherent and relevant. Pt denies current SI, HI and A/V/H.  Pt reported SI about 3 months ago with no plan and no hx of suicide attempts. Pt reported detox hx in [place] in 2006 and 1990s at [facility].  Pt reported [facility] for [facility] depression.   Pt reported both mother and father being "alcoholics." Pt identified his wife as a support.   Axis I: Alcohol Use Disorder Severe; Cocaine Use Disorder Severe; Major Depression Disorder, Recurrent Axis II: Deferred Axis III:  Past Medical History  Diagnosis Date  . Diabetes mellitus without complication   . Hypercholesteremia   . Manic depression    Axis IV: economic problems and problems with primary support group  Past Medical History:  Past Medical History  Diagnosis Date  . Diabetes mellitus without complication   . Hypercholesteremia   . Manic depression     Past Surgical History  Procedure Laterality Date  . Total hip arthroplasty      Family History: No family history on file.  Social History:  reports that he has been smoking Cigarettes.  He has been smoking about 0.00 packs per day. He does not have any  smokeless tobacco history on file. He reports that he drinks about 14.4 ounces of alcohol per week. He reports that he uses illicit drugs (Cocaine).  Additional Social History:  Alcohol / Drug Use Pain Medications: none reported Prescriptions: none reported Over the Counter: none reported History of alcohol / drug use?: Yes Longest period of sobriety (when/how long): 9 months in 2006-07 Negative Consequences of Use: Financial;Personal relationships;Work / School Withdrawal Symptoms: Agitation;Fever / Chills;Irritability;Sweats;Tingling Substance #1 Name of Substance 1: Alcohol 1 - Age of First Use: 5 1 - Amount (size/oz): 24 beers 1 - Frequency: 3-4 days a week 1 - Duration: 3 months 1 - Last Use / Amount: 04/20/13 Substance #2 Name of Substance 2: Cocaine 2 - Age of First Use: 16 2 - Amount (size/oz): $100-200 worth 2 - Frequency: 3-4 days a week 2 - Duration: 3 months 2 - Last Use / Amount: 04/20/13  CIWA: CIWA-Ar BP: 160/109 mmHg Pulse Rate: 80 COWS:    Allergies: No Known Allergies  Home Medications:  (Not in a hospital admission)  OB/GYN Status:  No LMP for male patient.  General Assessment Data [facility] Is this a Tele or Face-to-Face Assessment?: Tele Assessment Is this an Initial Assessment or a Re-assessment for this encounter?: Initial Assessment Living Arrangements: Spouse/significant other Can pt return to current living arrangement?: Yes Admission Status: Voluntary Is patient capable of signing voluntary admission?: Yes Transfer  from: Acute Hospital Referral Source: Self/Family/Friend  Medical Screening Exam [facility] Walk-in ONLY) Medical Exam completed: Yes  [facility] Crisis Care Plan Living Arrangements: Spouse/significant other Name of Psychiatrist:  (none reported) Name of Therapist:  (none reported)     Risk to self Suicidal Ideation: No-Not Currently/Within Last 6 Months Suicidal Intent: No Is patient at risk for suicide?:  No Suicidal Plan?: No Access to Means: No What has been your use of drugs/alcohol within the last 12 months?:  (ETOH and cocaine) Previous Attempts/Gestures: No How many times?: 0 Other Self Harm Risks:  (none reported) Triggers for Past Attempts: None known Intentional Self Injurious Behavior: None Family Suicide History: Unknown Recent stressful life event(s): Job Loss;Financial Problems;Other (Comment) (Pt reported 54 y/o son's mother took him from pt.) Persecutory voices/beliefs?: No Depression: Yes Depression Symptoms: Despondent;Insomnia;Isolating;Fatigue;Guilt;Feeling worthless/self pity;Feeling angry/irritable Substance abuse history and/or treatment for substance abuse?: Yes Suicide prevention information given to non-admitted patients: Not applicable  Risk to Others Homicidal Ideation: No Thoughts of Harm to Others: No Current Homicidal Intent: No Current Homicidal Plan: No Access to Homicidal Means: No Identified Victim:  (NA) History of harm to others?: No Assessment of Violence: None Noted Violent Behavior Description:  (Pt was calm and cooperative.) Does patient have access to weapons?: No Criminal Charges Pending?: No Does patient have a court date: No  Psychosis Hallucinations: None noted Delusions: None noted  Mental Status Report Appear/Hygiene: Other (Comment) (Pt was wearing paper scrubs. ) Eye Contact: Poor Motor Activity: Unremarkable Speech: Logical/coherent Level of Consciousness: Alert Mood: Depressed;Irritable Affect: Depressed Anxiety Level: Moderate Thought Processes: Coherent;Relevant Judgement: Impaired Orientation: Person;Place;Time;Situation Obsessive Compulsive Thoughts/Behaviors: None  Cognitive Functioning Concentration: Normal Memory: Recent Intact;Remote Intact IQ: Average Insight: Fair Impulse Control: Poor Appetite: Poor Weight Loss: 20 Weight Gain: 0 Sleep: Decreased Total Hours of Sleep: 4 Vegetative Symptoms:  None  ADLScreening [facility]) Patient's cognitive ability adequate to safely complete daily activities?: Yes Patient able to express need for assistance with ADLs?: Yes Independently performs ADLs?: Yes (appropriate for developmental age)  Prior Inpatient Therapy Prior Inpatient Therapy: Yes Prior Therapy Dates:  (2006, 1990s) Prior Therapy Facilty/Provider(s):  ([facility]- [place]) Reason for Treatment:  (Alcohol and Cocaine Detox)  Prior Outpatient Therapy Prior Outpatient Therapy: Yes Prior Therapy Dates:  (2009) Prior Therapy Facilty/Provider(s):  Physicist, medical) Reason for Treatment:  (med mgmt)  ADL Screening (condition at time of admission) Patient's cognitive ability adequate to safely complete daily activities?: Yes Is the patient deaf or have difficulty hearing?: No Does the patient have difficulty seeing, even when wearing glasses/contacts?: No Does the patient have difficulty concentrating, remembering, or making decisions?: No Patient able to express need for assistance with ADLs?: Yes Does the patient have difficulty dressing or bathing?: No Independently performs ADLs?: Yes (appropriate for developmental age)       Abuse/Neglect Assessment (Assessment to be complete while patient is alone) Physical Abuse: Denies Verbal Abuse: Denies Sexual Abuse: Denies Exploitation of patient/patient's resources: Denies Self-Neglect: Denies     Merchant navy officer (For Healthcare) Advance Directive: Patient does not have advance directive    Additional Information 1:1 In Past 12 Months?: No CIRT Risk: No Elopement Risk: No Does patient have medical clearance?: Yes     Disposition: Clinician consulted with Stephen Sam NP who states Pt meets criteria for inpatient detox. Stephen Guzman, AC confirmed bed availability. Pt assigned to bed 303-2 to Dr. Dub Guzman.  Stephen Guzman, MSW, LCSW Triage Specialist [phone] Disposition Initial Assessment  Completed for this Encounter: Yes  Disposition of Patient: Inpatient treatment program Type of inpatient treatment program: Adult  Stewart,Syris Brookens R 04/20/2013 5:47 AM

## 2013-04-20 NOTE — ED Notes (Signed)
Report received from Elige RadonP Smith, RN. Pt's belongings in labeled bags at nurses' desk. Renae Fickleaul removed envelope w/items to be locked w/security from room and placed in pt's belongings bag - advised pt is aware. Pt has black jacket lying at bottom of bed.

## 2013-04-20 NOTE — ED Notes (Signed)
Pt. requesting detox for alcohol and Cocaine abuse , denies suicidal ideation , last alcohol and Cocaine this evening .

## 2013-04-20 NOTE — ED Provider Notes (Signed)
Medical screening examination/treatment/procedure(s) were performed by non-physician practitioner and as supervising physician I was immediately available for consultation/collaboration.  EKG Interpretation   None         Enid SkeensJoshua M Wade Sigala, MD 04/20/13 [phone]

## 2013-04-21 LAB — GLUCOSE, CAPILLARY
GLUCOSE-CAPILLARY: 275 mg/dL — AB (ref 70–99)
Glucose-Capillary: 361 mg/dL — ABNORMAL HIGH (ref 70–99)
Glucose-Capillary: 501 mg/dL — ABNORMAL HIGH (ref 70–99)
Glucose-Capillary: 536 mg/dL — ABNORMAL HIGH (ref 70–99)
Glucose-Capillary: 455 mg/dL — ABNORMAL HIGH (ref 70–99)

## 2013-04-21 MED ORDER — LIVING WELL WITH DIABETES BOOK
Freq: Once | Status: DC
  Filled 2013-04-21: qty 1

## 2013-04-21 MED ORDER — METFORMIN HCL ER 500 MG PO TB24
500.0000 mg | ORAL_TABLET | Freq: Two times a day (BID) | ORAL | Status: DC
  Administered 2013-04-21 – 2013-04-25 (×8): 500 mg via ORAL
  Filled 2013-04-21 (×3): qty 1
  Filled 2013-04-21: qty 14
  Filled 2013-04-21: qty 1
  Filled 2013-04-21: qty 14
  Filled 2013-04-21 (×8): qty 1

## 2013-04-21 MED ORDER — INSULIN ASPART 100 UNIT/ML ~~LOC~~ SOLN
15.0000 [IU] | Freq: Once | SUBCUTANEOUS | Status: AC
  Administered 2013-04-21: 15 [IU] via SUBCUTANEOUS

## 2013-04-21 MED ORDER — INSULIN ASPART 100 UNIT/ML ~~LOC~~ SOLN
12.0000 [IU] | Freq: Once | SUBCUTANEOUS | Status: AC
  Administered 2013-04-21: 12 [IU] via SUBCUTANEOUS

## 2013-04-21 MED ORDER — INSULIN GLARGINE 100 UNIT/ML ~~LOC~~ SOLN
10.0000 [IU] | Freq: Every day | SUBCUTANEOUS | Status: DC
  Administered 2013-04-21: 10 [IU] via SUBCUTANEOUS

## 2013-04-21 NOTE — Progress Notes (Signed)
[facility] MD Progress Note  04/21/2013 5:23 PM Stephen Guzman  MRN:  [id number] Subjective:  Stephen Guzman continues to be detox. He states he needs to go to rehab. Still dealing with shame and guilt for having allowed his addiction come first before his job. States is took a long time to find one and having lost it because of his drug use he states is very disappointing.  Diagnosis:   DSM5: Schizophrenia Disorders:  none Obsessive-Compulsive Disorders:  none Trauma-Stressor Disorders:  none Substance/Addictive Disorders:  Alcohol Related Disorder - Severe (303.90), Cocaine related disorder severe Depressive Disorders:  Major Depressive Disorder - Moderate (296.22) Total Time spent with patient: 30 minutes  Axis I: Substance Induced Mood Disorder  ADL's:  Intact  Sleep: Fair  Appetite:  Fair  Suicidal Ideation:  Plan:  denies Intent:  denies Means:  denies Homicidal Ideation:  Plan:  denies Intent:  denies Means:  denies AEB (as evidenced by):  Psychiatric Specialty Exam: Physical Exam  Review of Systems  Constitutional: Negative.   HENT: Negative.   Eyes: Negative.   Respiratory: Negative.   Cardiovascular: Negative.   Gastrointestinal: Negative.   Genitourinary: Negative.   Musculoskeletal: Negative.   Skin: Negative.   Neurological: Negative.   Endo/Heme/Allergies: Negative.   Psychiatric/Behavioral: Positive for substance abuse. The patient is nervous/anxious and has insomnia.     Blood pressure 131/86, pulse 99, temperature 97.6 F (36.4 C), temperature source Oral, resp. rate 24, height 6' (1.829 m), weight 111.131 kg (245 lb).Body mass index is 33.22 kg/(m^2).  General Appearance: Fairly Groomed  Patent attorney::  Fair  Speech:  Clear and Coherent  Volume:  Normal  Mood:  Anxious and worried  Affect:  anxious, worried  Thought Process:  Coherent and Goal Directed  Orientation:  Full (Time, Place, and Person)  Thought Content:  symtpoms, worries, concerns  Suicidal  Thoughts:  No  Homicidal Thoughts:  No  Memory:  Immediate;   Fair Recent;   Fair Remote;   Fair  Judgement:  Fair  Insight:  Present  Psychomotor Activity:  Restlessness  Concentration:  Fair  Recall:  [employer] of Knowledge:Fair  Language: Fair  Akathisia:  No  Handed:    AIMS (if indicated):     Assets:  Desire for Improvement  Sleep:  Number of Hours: 4.5   Musculoskeletal: Strength & Muscle Tone: within normal limits Gait & Station: normal Patient leans: N/A  Current Medications: Current Facility-Administered Medications  Medication Dose Route Frequency Provider Last Rate Last Dose  . acetaminophen (TYLENOL) tablet 650 mg  650 mg Oral Q6H PRN Kristeen Mans, NP      . alum & mag hydroxide-simeth (MAALOX/MYLANTA) 200-200-20 MG/5ML suspension 30 mL  30 mL Oral PRN Kristeen Mans, NP      . chlordiazePOXIDE (LIBRIUM) capsule 25 mg  25 mg Oral Q6H PRN Rachael Fee, MD      . chlordiazePOXIDE (LIBRIUM) capsule 25 mg  25 mg Oral TID Rachael Fee, MD   25 mg at 04/21/13 1213   Followed by  . [START ON 04/22/2013] chlordiazePOXIDE (LIBRIUM) capsule 25 mg  25 mg Oral BH-qamhs Rachael Fee, MD       Followed by  . [START ON 04/23/2013] chlordiazePOXIDE (LIBRIUM) capsule 25 mg  25 mg Oral Daily Rachael Fee, MD      . hydrOXYzine (ATARAX/VISTARIL) tablet 25 mg  25 mg Oral Q6H PRN Rachael Fee, MD   25 mg at 04/20/13 2238  .  insulin aspart (novoLOG) injection 0-15 Units  0-15 Units Subcutaneous TID WC Kristeen MansFran E Hobson, NP   15 Units at 04/21/13 1214  . insulin aspart (novoLOG) injection 4 Units  4 Units Subcutaneous TID WC Kristeen MansFran E Hobson, NP   4 Units at 04/21/13 1214  . lisinopril (PRINIVIL,ZESTRIL) tablet 5 mg  5 mg Oral Daily Rachael FeeIrving A Laurie Penado, MD   5 mg at 04/21/13 [phone]  . living well with diabetes book MISC   Does not apply Once Rachael FeeIrving A Dorothy Polhemus, MD      . loperamide (IMODIUM) capsule 2-4 mg  2-4 mg Oral PRN Rachael FeeIrving A Dierre Crevier, MD      . magnesium hydroxide (MILK OF MAGNESIA) suspension 30 mL   30 mL Oral Daily PRN Kristeen MansFran E Hobson, NP      . multivitamin with minerals tablet 1 tablet  1 tablet Oral Daily Rachael FeeIrving A Eisa Necaise, MD   1 tablet at 04/21/13 0818  . nicotine (NICODERM CQ - dosed in mg/24 hours) patch 21 mg  21 mg Transdermal Daily Kristeen MansFran E Hobson, NP   21 mg at 04/21/13 0820  . ondansetron (ZOFRAN-ODT) disintegrating tablet 4 mg  4 mg Oral Q6H PRN Rachael FeeIrving A Sabastien Tyler, MD      . thiamine (VITAMIN B-1) tablet 100 mg  100 mg Oral Daily Rachael FeeIrving A Lamin Chandley, MD   100 mg at 04/21/13 0818  . traZODone (DESYREL) tablet 50 mg  50 mg Oral QHS PRN Kristeen MansFran E Hobson, NP   50 mg at 04/20/13 2238    Lab Results:  Results for orders placed during the hospital encounter of 04/20/13 (from the past 48 hour(s))  GLUCOSE, CAPILLARY     Status: Abnormal   Collection Time    04/20/13 11:39 AM      Result Value Ref Range   Glucose-Capillary 302 (*) 70 - 99 mg/dL  GLUCOSE, CAPILLARY     Status: Abnormal   Collection Time    04/20/13  5:35 PM      Result Value Ref Range   Glucose-Capillary 333 (*) 70 - 99 mg/dL  GLUCOSE, CAPILLARY     Status: Abnormal   Collection Time    04/20/13  9:27 PM      Result Value Ref Range   Glucose-Capillary 239 (*) 70 - 99 mg/dL   Comment 1 Notify RN     Comment 2 Documented in Chart    GLUCOSE, CAPILLARY     Status: Abnormal   Collection Time    04/21/13  6:03 AM      Result Value Ref Range   Glucose-Capillary 275 (*) 70 - 99 mg/dL   Comment 1 Notify RN     Comment 2 Documented in Chart    GLUCOSE, CAPILLARY     Status: Abnormal   Collection Time    04/21/13 12:05 PM      Result Value Ref Range   Glucose-Capillary 361 (*) 70 - 99 mg/dL  GLUCOSE, CAPILLARY     Status: Abnormal   Collection Time    04/21/13  4:53 PM      Result Value Ref Range   Glucose-Capillary 501 (*) 70 - 99 mg/dL   Comment 1 Repeat Test    GLUCOSE, CAPILLARY     Status: Abnormal   Collection Time    04/21/13  5:18 PM      Result Value Ref Range   Glucose-Capillary 536 (*) 70 - 99 mg/dL   Comment 1  Notify RN  Physical Findings: AIMS:  , ,  ,  ,    CIWA:  CIWA-Ar Total: 0 COWS:  COWS Total Score: 2  Treatment Plan Summary: Daily contact with patient to assess and evaluate symptoms and progress in treatment Medication management  Plan: Supportive approach/coping skills/relapse prevention           Continue detox           Reassess and address the co morbidities  Medical Decision Making Problem Points:  Review of psycho-social stressors (1) Data Points:  Review of medication regiment & side effects (2)  I certify that inpatient services furnished can reasonably be expected to improve the patient's condition.   Verona Hartshorn A 04/21/2013, 5:23 PM

## 2013-04-21 NOTE — [facility] Group Notes (Signed)
[facility] LCSW Group Therapy  04/21/2013 3:14 PM  Type of Therapy:  Group Therapy  Participation Level:  Active  Participation Quality:  Attentive  Affect:  Appropriate  Cognitive:  Alert and Oriented  Insight:  Improving  Engagement in Therapy:  Improving  Modes of Intervention:  Confrontation, Discussion, Education, Exploration, Problem-solving, Rapport Building, Socialization and Support  Summary of Progress/Problems: Today's Topic: Overcoming Obstacles. Pt identified obstacles faced currently and processed barriers involved in overcoming these obstacles. Pt identified steps necessary for overcoming these obstacles and explored motivation (internal and external) for facing these difficulties head on. Pt further identified one area of concern in their lives and chose a skill of focus pulled from their "toolbox." [place] shared that his biggest obstacle involves getting into an inpatient facility for treatment after detox. Coyt shared that he and his wife are having marital problems due to his chronic substance abuse. Krishay shows progress in the group setting and improving insight AEB his ability to process how inpatient treatment can help him develop healthy coping skills and deal with stressors in a better way.    Smart, Shalandra Leu LCSWA  04/21/2013, 3:14 PM

## 2013-04-21 NOTE — Progress Notes (Signed)
D: Patient denies SI/HI and auditory and visual hallucinations. Patient has an anxious mood and affect. The patient exhibits no detox symptoms at this time. Patient is attending groups and interacting appropriately within the milieu.  A: Patient given emotional support from RN. Patient encouraged to come to staff with concerns and/or questions. Patient's medication routine continued. Patient's orders and plan of care reviewed.  R: Patient remains appropriate and cooperative. Will continue to monitor patient q15 minutes for safety.

## 2013-04-21 NOTE — Progress Notes (Addendum)
D Pt. Denies SI and HI,  No complaints of pain or discomfort noted at this time.  A Writer offered support and encouragement.  Discussed coping skills with pt.  R Pt. Remains safe on the unit.  Pt. Reports that his wife was also an addict, he met her in rehab and they have been married 8 years.  States she is an LPN,  She has remained clean and has told pt. She cannot live with him, when he is drinking or using drugs, ( "she refuses to be an enabler"), it is causing financial difficulties in the home.  Pt. States he knows all the coping skills and knows what he needs to do but allows the cocaine and ETOH to win.  Pt. Is hoping for a 90 treatment program.  Pt. Attended a 9 months program at one time and remained sober and clean or appr. 18 months, this is his longest period of sobriety.   Pt.'s wife visited this PM.  Writer spoke to the pt. Concerning his metformin that was added to his medications list.  The wife " Hey are you his nurse? " What are you giving my husband, this is not my husband, his speech is slurred and this is not him".  Writer" I just got through assessing him and his speech appeared fine to me, I did not hear any slurring". Pt. Stated" That was this morning, and I told the nurse and MD about it, I am not taking any sleep medication tonight".  Wife came to the nurses station again asking for writer.  " Hey, I don't know what you are giving my husband but I do not want him getting drugs or hearing slurreded speech, he mine as well be out on the street".  Writer" I do not have permission to discuss his care with you but most of our patients  Are on a protocol which helps them with withdrawals etc.".  Pt.'s wife "  I want to talk to his Doctor, what time will he be in tomorrow, leave a note that I want to talk to him".

## 2013-04-21 NOTE — Tx Team (Signed)
Interdisciplinary Treatment Plan Update (Adult)  Date: 04/21/2013   Time Reviewed: 11:15 AM  Progress in Treatment:  Attending groups: No.  Participating in groups:  No.  Taking medication as prescribed: Yes  Tolerating medication: Yes  Family/Significant othe contact made: No. SPE not required for pt.  Patient understands diagnosis: Yes, AEB seeking treatment for ETOH detox, cocaine abuse, and mood stabilization.  Discussing patient identified problems/goals with staff: Yes  Medical problems stabilized or resolved: Yes  Denies suicidal/homicidal ideation: Yes during admission, self report.  Patient has not harmed self or Others: Yes  New problem(s) identified:  Discharge Plan or Barriers: Pt not attending d/c planning. CSW assessing for appropriate referrals.  Additional comments:  48 Y/O male who states that he "pick up alcohol and cocaine again," 3 months ago. States he has been binging on and off for 9 months. Drinks from 12 to 24 beers, and or liquor, or wine. "Variety drunk". States that the crack cocaine comes together with the alcohol. . States that her sons mother got him for Christmas, and did not bring him back. States he is upset as his son has behavioral issues and the mother drops him whe he is not doing well and when he got to behave better she comes and "snatches" him back. She lives in [place]. This got him really upset. He is currently unemployed as he lost his job due to his substance use Reason for Continuation of Hospitalization: Librium taper-withdrawals Mood stabilization Medication management  Estimated length of stay: 3-5 days  For review of initial/current patient goals, please see plan of care.  Attendees:  Patient:    Family:    Physician: Stephen Guzman  04/21/2013 11:21 AM   Nursing:     Clinical Social Worker [employer] Sherwin-WilliamsHeather Smart, LCSWA  04/21/2013 11:21 AM   Other:    Other:    Other:    Other:    Scribe for Treatment Team:  Trula SladeHeather Smart LCSWA 04/21/2013 11:15 AM

## 2013-04-21 NOTE — Progress Notes (Signed)
Inpatient Diabetes Program Recommendations  AACE/ADA: New Consensus Statement on Inpatient Glycemic Control (2013)  Target Ranges:  Prepandial:   less than 140 mg/dL      Peak postprandial:   less than 180 mg/dL (1-2 hours)      Critically ill patients:  140 - 180 mg/dL   Reason for Assessment: Diabetes Consult  Diabetes history: Type 2 Outpatient Diabetes medications: None. Has been on Lantus 30 units QHS and Novolog 3 units tidwc in the past Current orders for Inpatient glycemic control: Novolog moderate tidwc and 3 units tidwc for meal coverage insulin  48 year old male with history of hypertension, diabetes, depression polys substance abuse who presents with request for help with alcohol and cocaine detox. Hx Type 2 DM. Has been on Lantus and Novolog in the past, but has not had insulin since May 2014.  Pt has no insurance at present.  Results for Catarina HartshornCANADY, Koleman (MRN [id number]) as of 04/21/2013 13:50  Ref. Range 04/20/2013 02:16  Sodium Latest Range: 137-147 mEq/L 135 (L)  Potassium Latest Range: 3.7-5.3 mEq/L 3.7  Chloride Latest Range: 96-112 mEq/L 98  CO2 Latest Range: 19-32 mEq/L 23  BUN Latest Range: 6-23 mg/dL 9  Creatinine Latest Range: 0.50-1.35 mg/dL 6.010.76  Calcium Latest Range: 8.4-10.5 mg/dL 8.8  GFR calc non Af Amer Latest Range: >90 mL/min >90  GFR calc Af Amer Latest Range: >90 mL/min >90  Glucose Latest Range: 70-99 mg/dL 093258 (H)  Results for Catarina HartshornCANADY, Roddie (MRN [id number]) as of 04/21/2013 13:50  Ref. Range 04/20/2013 11:39 04/20/2013 17:35 04/20/2013 21:27 04/21/2013 06:03  Glucose-Capillary Latest Range: 70-99 mg/dL 254302 (H) 270333 (H) 623239 (H) 275 (H)   Need HgbA1C to assess glycemic control. Will benefit from addition of basal insulin.  Pt will need affordable insulin at discharge such as Novolin 70/30 or ReliOn NPH and Regular insulin.  Inpatient Diabetes Program Recommendations Insulin - Basal: Please consider addition of Lantus 20 units QHS Correction (SSI): Add HS  correction Insulin - Meal Coverage: Increase meal coverage insulin to 6 units tidwc HgbA1C: Need HgbA1C to assess glycemic control prior to hospitalization Diet: CHO mod medium - Please encourage pt to make healthy choices using portion control in cafeteria  Note: RN - please allow pt to give himself insulin to check technique. Will need prescription for meter and supplies at discharge. Case manager to assist with PCP and/or [facility]. Will order Living Well With Diabetes book for pt. Will continue to follow.  Thank you. Ailene Ardshonda Abed Schar, RD, LDN, CDE Inpatient Diabetes Coordinator [phone]

## 2013-04-21 NOTE — Progress Notes (Signed)
Adult Psychoeducational Group Note  Date:  04/21/2013 Time:  10:00 pm  Group Topic/Focus:  Wrap-Up Group:   The focus of this group is to help patients review their daily goal of treatment and discuss progress on daily workbooks.  Participation Level:  Active  Participation Quality:  Appropriate and Sharing  Affect:  Appropriate  Cognitive:  Appropriate  Insight: Appropriate  Engagement in Group:  Engaged  Modes of Intervention:  Discussion, Education, Socialization and Support  Additional Comments:  Pt stated that by working and shopping that he can maintain positive mental health. Pt stated that by getting high, lying and people pleasing that he will decrease his overall health and wellness.   Laural BenesJohnson, Journi Moffa 04/21/2013, 4:21 PM

## 2013-04-21 NOTE — [facility] Suicide Risk Assessment (Signed)
[facility] INPATIENT: Family/Significant Other Suicide Prevention Education  Suicide Prevention Education:  Education Completed; No one has been identified by the patient as the family member/significant other with whom the patient will be residing, and identified as the person(s) who will aid the patient in the event of a mental health crisis (suicidal ideations/suicide attempt).   Pt did not c/o SI at admission, nor have they endorsed SI during their stay here. SPE not required. SPI pamphlet provided to pt and he was encouraged to share information with support network, ask questions, and talk about any concerns relating to SPE.  [employer] Sher[employer]-WilliamsHeather Smart, LCSWA 04/21/2013 11:35 AM

## 2013-04-21 NOTE — Progress Notes (Addendum)
NUTRITION ASSESSMENT  Pt identified as at risk on the Malnutrition Screen Tool  INTERVENTION: 1. Educated patient on the importance of nutrition and encouraged intake of food and beverages.  Provided patient with handout on sobriety and nutrition. 2. Discussed weight goals. 3. Supplements: MVI and thiamine daily  NUTRITION DIAGNOSIS: Unintentional weight loss related to sub-optimal intake as evidenced by pt report.   Goal: Pt to meet >/= 90% of their estimated nutrition needs.  Monitor:  PO intake  Assessment:  Patient admitted for etoh and cocaine detox.  Report good appetite and intake currently and prior to admit.  UBW 265 lbs 1 1/2 months ago with weight loss secondary to "work, stress, etoh, and drugs".  Chart reflects only a 4 lb weight loss during this time period.  Hx of DM noted.  48 y.o. male  Height: Ht Readings from Last 1 Encounters:  04/20/13 6' (1.829 m)    Weight: Wt Readings from Last 1 Encounters:  04/20/13 245 lb (111.131 kg)    Weight Hx: Wt Readings from Last 10 Encounters:  04/20/13 245 lb (111.131 kg)  04/20/13 245 lb (111.131 kg)  01/26/13 249 lb 12.8 oz (113.309 kg)    BMI:  Body mass index is 33.22 kg/(m^2). Pt meets criteria for obesity grade 1 based on current BMI.  Estimated Nutritional Needs: Kcal: 25-30 kcal/kg Protein: > 1 gram protein/kg Fluid: 1 ml/kcal  Diet Order: Carb Control Pt is also offered choice of unit snacks mid-morning and mid-afternoon.  Pt is eating as desired.   Lab results and medications reviewed.   Oran ReinLaura Donna Snooks, RD, LDN Clinical Inpatient Dietitian Pager:  [phone] Weekend and after hours pager:  [phone]
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
## 2013-04-21 NOTE — [facility] Group Notes (Signed)
[facility] Note   04/21/2013 9:44 AM  Participation Quality:  Did not attend-pt in bed sleeping   Smart, Laniece Hornbaker LCSWA

## 2013-04-22 LAB — GLUCOSE, CAPILLARY
Glucose-Capillary: 298 mg/dL — ABNORMAL HIGH (ref 70–99)
Glucose-Capillary: 357 mg/dL — ABNORMAL HIGH (ref 70–99)
Glucose-Capillary: 305 mg/dL — ABNORMAL HIGH (ref 70–99)
Glucose-Capillary: 358 mg/dL — ABNORMAL HIGH (ref 70–99)
Glucose-Capillary: 367 mg/dL — ABNORMAL HIGH (ref 70–99)

## 2013-04-22 MED ORDER — INSULIN ASPART 100 UNIT/ML ~~LOC~~ SOLN
6.0000 [IU] | Freq: Three times a day (TID) | SUBCUTANEOUS | Status: DC
  Administered 2013-04-22 – 2013-04-24 (×6): 6 [IU] via SUBCUTANEOUS

## 2013-04-22 MED ORDER — INSULIN GLARGINE 100 UNIT/ML ~~LOC~~ SOLN
20.0000 [IU] | Freq: Every day | SUBCUTANEOUS | Status: DC
Start: 2013-04-22 — End: 2013-04-24
  Administered 2013-04-22 – 2013-04-23 (×2): 20 [IU] via SUBCUTANEOUS
  Filled 2013-04-22: qty 0.2

## 2013-04-22 NOTE — Progress Notes (Signed)
The focus of this group is to educate the patient on the purpose and policies of crisis stabilization and provide a format to answer questions about their admission.  The group details unit policies and expectations of patients while admitted. Patient reports that " Im going to stay in the moment"

## 2013-04-22 NOTE — BHH Group Notes (Signed)
[facility] LCSW Group Therapy  04/22/2013 2:00 PM  Type of Therapy:  Group Therapy  Participation Level:  None  Participation Quality:  Drowsy  Affect:  Lethargic  Cognitive:  Lacking  Insight:  Limited  Engagement in Therapy:  Limited  Modes of Intervention:  Confrontation, Discussion, Education, Exploration, Problem-solving, Rapport Building, Socialization and Support  Summary of Progress/Problems: MHA Speaker came to talk about his personal journey with substance abuse and addiction. The pt processed ways by which to relate to the speaker. MHA speaker provided handouts and educational information pertaining to groups and services offered by [facility]. [name] was lethargic during group and had difficulty remaining attentive. He had been complaining of drowsiness this morning to CSW and apologized for sleeping through majority of group. At this time, Stephen Guzman is making limited progress in the group setting due to extreme drowsiness.   Smart, Jeylin Woodmansee LCSWA 04/22/2013, 2:00 PM

## 2013-04-22 NOTE — Clinical Social Work Note (Signed)
Pt's wife called CSW twice and left voice mails. CSW left message for pt's wife to call back-also let her know about d/c plan ([facility] and told her that her concerns about his drowsiness/meds have been brought to Dr. Runell GessLugo's attention today.  [address][name], LCSWA  04/22/2013 2:33 PM

## 2013-04-22 NOTE — Progress Notes (Signed)
D: Patient denies SI/HI and A/V hallucinations; patient reports sleep is poor due to having to use the restroom frequently; reports appetite is improving; reports energy level is low ; reports ability to pay attention is improving; rates depression as 4/10; rates hopelessness 1/10; patient is not reporting any withdrawal symptoms; patient concerned about his blood sugar   A: Monitored q 15 minutes; patient encouraged to attend groups; patient educated about medications; patient given medications per physician orders; patient encouraged to express feelings and/or concerns; educated patient and encourage patient to eat a carb modified diet to help with blood sugars  R: Patient can be demanding but has been cooperative; patient is very assertive but overall he is appropriate to circumstances; patient was able to set goal to talk with staff 1:1 when having feelings of SI; patient is taking medications as prescribed and tolerating medications; patient is attending all groups

## 2013-04-22 NOTE — Progress Notes (Signed)
D: Patient resting in bed with eyes closed.  Respirations even and unlabored.  Patient appears to be in no apparent distress. A: Staff to monitor Q 15 mins for safety.   R:Patient remains safe on the unit.  

## 2013-04-22 NOTE — Progress Notes (Signed)
D Pt. Denies SI and HI, no complaints of pain or discomfort noted at this time.  A Writer offered support and encouragement. Discussed discharge plans with pt.  R Pt. Remains safe on the unit.  Pt. Will be discharging Thursday to continue treatment for substance abuse.

## 2013-04-22 NOTE — Progress Notes (Signed)
[facility] MD Progress Note  04/22/2013 3:16 PM Mory Herrman  MRN:  [id number] Subjective:  Elzy states that what happened in December when her sons mother took him and did not bring him back has affected him real badly. He states that for the last year and a half he kept his son, providing discipline, structure, getting him on a basketball team, doing well in school and after his son told his mother that he was being "mean" to him she decided not to bring him back. Chrisotpher is concerned about him staying with her due to the lack of discipline, the different value system, etc. States he is very angry with both. He states that his current wife is upset with him and wants him to go to a program for a year before she would be willing to consider being back together.  Diagnosis:   DSM5: Schizophrenia Disorders:  none Obsessive-Compulsive Disorders:  none Trauma-Stressor Disorders:  none Substance/Addictive Disorders:  Alcohol Related Disorder - Severe (303.90), Cocaine related disorder Depressive Disorders:  Major Depressive Disorder - Moderate (296.22) Total Time spent with patient: 30 minutes  Axis I: Anxiety Disorder NOS and Mood Disorder NOS  ADL's:  Intact  Sleep: Poor  Appetite:  Fair  Suicidal Ideation:  Plan:  deenies Intent:  denies Means:  denies Homicidal Ideation:  Plan:  denies Intent:  denies Means:  denies AEB (as evidenced by):  Psychiatric Specialty Exam: Physical Exam  Review of Systems  Constitutional: Negative.   HENT: Negative.   Eyes: Negative.   Respiratory: Negative.   Cardiovascular: Negative.   Gastrointestinal: Negative.   Genitourinary: Negative.   Musculoskeletal: Negative.   Skin: Negative.   Neurological: Negative.   Endo/Heme/Allergies: Negative.   Psychiatric/Behavioral: Positive for depression and substance abuse. The patient is nervous/anxious and has insomnia.     Blood pressure 139/101, pulse 87, temperature 99.6 F (37.6 C), temperature source  Oral, resp. rate 16, height 6' (1.829 m), weight 111.131 kg (245 lb).Body mass index is 33.22 kg/(m^2).  General Appearance: Fairly Groomed  Patent attorney::  Fair  Speech:  Clear and Coherent  Volume:  Normal  Mood:  Anxious and worried  Affect:  anxious  Thought Process:  Coherent and Goal Directed  Orientation:  Full (Time, Place, and Person)  Thought Content:  worries, concerns about his son  Suicidal Thoughts:  No  Homicidal Thoughts:  No  Memory:  Immediate;   Fair Recent;   Fair Remote;   Fair  Judgement:  Fair  Insight:  Present  Psychomotor Activity:  Restlessness  Concentration:  Fair  Recall:  [employer] of Knowledge:NA  Language: Fair  Akathisia:  No  Handed:    AIMS (if indicated):     Assets:  Desire for Improvement Housing  Sleep:  Number of Hours: 4.25   Musculoskeletal: Strength & Muscle Tone: within normal limits Gait & Station: normal Patient leans: N/A  Current Medications: Current Facility-Administered Medications  Medication Dose Route Frequency Provider Last Rate Last Dose  . acetaminophen (TYLENOL) tablet 650 mg  650 mg Oral Q6H PRN Kristeen Mans, NP      . alum & mag hydroxide-simeth (MAALOX/MYLANTA) 200-200-20 MG/5ML suspension 30 mL  30 mL Oral PRN Kristeen Mans, NP      . chlordiazePOXIDE (LIBRIUM) capsule 25 mg  25 mg Oral Q6H PRN Rachael Fee, MD      . chlordiazePOXIDE (LIBRIUM) capsule 25 mg  25 mg Oral BH-qamhs Rachael Fee, MD   [phone]  mg at 04/22/13 0853   Followed by  . [START ON 04/23/2013] chlordiazePOXIDE (LIBRIUM) capsule 25 mg  25 mg Oral Daily Rachael FeeIrving A Aicia Babinski, MD      . hydrOXYzine (ATARAX/VISTARIL) tablet 25 mg  25 mg Oral Q6H PRN Rachael FeeIrving A Samirah Scarpati, MD   25 mg at 04/20/13 2238  . insulin aspart (novoLOG) injection 0-15 Units  0-15 Units Subcutaneous TID WC Kristeen MansFran E Hobson, NP   11 Units at 04/22/13 1208  . insulin aspart (novoLOG) injection 6 Units  6 Units Subcutaneous TID WC Rachael FeeIrving A Roxana Lai, MD      . insulin glargine (LANTUS) injection 20  Units  20 Units Subcutaneous QHS Beau FannyJohn C Withrow, FNP      . lisinopril (PRINIVIL,ZESTRIL) tablet 5 mg  5 mg Oral Daily Rachael FeeIrving A Briscoe Daniello, MD   5 mg at 04/22/13 [phone]  . living well with diabetes book MISC   Does not apply Once Rachael FeeIrving A Benancio Osmundson, MD      . loperamide (IMODIUM) capsule 2-4 mg  2-4 mg Oral PRN Rachael FeeIrving A Bryleigh Ottaway, MD      . magnesium hydroxide (MILK OF MAGNESIA) suspension 30 mL  30 mL Oral Daily PRN Kristeen MansFran E Hobson, NP      . metFORMIN (GLUCOPHAGE-XR) 24 hr tablet 500 mg  500 mg Oral BID WC Beau FannyJohn C Withrow, FNP   500 mg at 04/22/13 0853  . multivitamin with minerals tablet 1 tablet  1 tablet Oral Daily Rachael FeeIrving A Gidget Quizhpi, MD   1 tablet at 04/22/13 [phone]  . nicotine (NICODERM CQ - dosed in mg/24 hours) patch 21 mg  21 mg Transdermal Daily Kristeen MansFran E Hobson, NP   21 mg at 04/21/13 0820  . ondansetron (ZOFRAN-ODT) disintegrating tablet 4 mg  4 mg Oral Q6H PRN Rachael FeeIrving A Mico Spark, MD      . thiamine (VITAMIN B-1) tablet 100 mg  100 mg Oral Daily Rachael FeeIrving A Quinnlyn Hearns, MD   100 mg at 04/22/13 0853  . traZODone (DESYREL) tablet 50 mg  50 mg Oral QHS PRN Kristeen MansFran E Hobson, NP   50 mg at 04/20/13 2238    Lab Results:  Results for orders placed during the hospital encounter of 04/20/13 (from the past 48 hour(s))  GLUCOSE, CAPILLARY     Status: Abnormal   Collection Time    04/20/13  5:35 PM      Result Value Ref Range   Glucose-Capillary 333 (*) 70 - 99 mg/dL  GLUCOSE, CAPILLARY     Status: Abnormal   Collection Time    04/20/13  9:27 PM      Result Value Ref Range   Glucose-Capillary 239 (*) 70 - 99 mg/dL   Comment 1 Notify RN     Comment 2 Documented in Chart    GLUCOSE, CAPILLARY     Status: Abnormal   Collection Time    04/21/13  6:03 AM      Result Value Ref Range   Glucose-Capillary 275 (*) 70 - 99 mg/dL   Comment 1 Notify RN     Comment 2 Documented in Chart    GLUCOSE, CAPILLARY     Status: Abnormal   Collection Time    04/21/13 12:05 PM      Result Value Ref Range   Glucose-Capillary 361 (*) 70 - 99 mg/dL   GLUCOSE, CAPILLARY     Status: Abnormal   Collection Time    04/21/13  4:53 PM      Result Value Ref Range  Glucose-Capillary 501 (*) 70 - 99 mg/dL   Comment 1 Repeat Test    GLUCOSE, CAPILLARY     Status: Abnormal   Collection Time    04/21/13  5:18 PM      Result Value Ref Range   Glucose-Capillary 536 (*) 70 - 99 mg/dL   Comment 1 Notify RN    GLUCOSE, CAPILLARY     Status: Abnormal   Collection Time    04/21/13  9:38 PM      Result Value Ref Range   Glucose-Capillary 455 (*) 70 - 99 mg/dL   Comment 1 Notify RN    GLUCOSE, CAPILLARY     Status: Abnormal   Collection Time    04/22/13  6:11 AM      Result Value Ref Range   Glucose-Capillary 298 (*) 70 - 99 mg/dL   Comment 1 Notify RN    GLUCOSE, CAPILLARY     Status: Abnormal   Collection Time    04/22/13  9:43 AM      Result Value Ref Range   Glucose-Capillary 357 (*) 70 - 99 mg/dL  GLUCOSE, CAPILLARY     Status: Abnormal   Collection Time    04/22/13 11:55 AM      Result Value Ref Range   Glucose-Capillary 305 (*) 70 - 99 mg/dL    Physical Findings: AIMS: Facial and Oral Movements Muscles of Facial Expression: None, normal Lips and Perioral Area: None, normal Jaw: None, normal Tongue: None, normal,Extremity Movements Upper (arms, wrists, hands, fingers): None, normal Lower (legs, knees, ankles, toes): None, normal, Trunk Movements Neck, shoulders, hips: None, normal, Overall Severity Severity of abnormal movements (highest score from questions above): None, normal Incapacitation due to abnormal movements: None, normal Patient's awareness of abnormal movements (rate only patient's report): No Awareness, Dental Status Current problems with teeth and/or dentures?: No Does patient usually wear dentures?: No  CIWA:  CIWA-Ar Total: 0 COWS:  COWS Total Score: 2  Treatment Plan Summary: Daily contact with patient to assess and evaluate symptoms and progress in treatment Medication management  Plan: Supportive  approach/coping skills/relapse prevention           Reassess and address the co morbidities            Medical Decision Making Problem Points:  Review of psycho-social stressors (1) Data Points:  Review of medication regiment & side effects (2)  I certify that inpatient services furnished can reasonably be expected to improve the patient's condition.   Jaren Vanetten A 04/22/2013, 3:16 PM

## 2013-04-22 NOTE — Progress Notes (Signed)
Recreation Therapy Notes  Date: 02.24.2015 Time: 2:45pm Location: [address]   Group Topic: Wellness  Goal Area(s) Addresses:  Patient will define components of whole wellness. Patient will verbalize benefit of whole wellness.  Behavioral Response: Did not attend.   Alexcis Bicking L Burnie Therien, LRT/CTRS  Reyce Lubeck L 04/22/2013 9:21 AM 

## 2013-04-23 LAB — GLUCOSE, CAPILLARY
GLUCOSE-CAPILLARY: 267 mg/dL — AB (ref 70–99)
Glucose-Capillary: 315 mg/dL — ABNORMAL HIGH (ref 70–99)
Glucose-Capillary: 380 mg/dL — ABNORMAL HIGH (ref 70–99)
Glucose-Capillary: 419 mg/dL — ABNORMAL HIGH (ref 70–99)

## 2013-04-23 MED ORDER — METFORMIN HCL ER 500 MG PO TB24: 500.0000 mg | ORAL_TABLET | Freq: Two times a day (BID) | ORAL | Status: DC

## 2013-04-23 MED ORDER — INSULIN GLARGINE 100 UNIT/ML ~~LOC~~ SOLN: 20.0000 [IU] | Freq: Every day | SUBCUTANEOUS | Status: DC

## 2013-04-23 MED ORDER — TRAZODONE HCL 50 MG PO TABS
50.0000 mg | ORAL_TABLET | Freq: Every evening | ORAL | Status: DC | PRN
Start: 2013-04-23 — End: 2013-12-17

## 2013-04-23 MED ORDER — LISINOPRIL 5 MG PO TABS: 5.0000 mg | ORAL_TABLET | Freq: Every day | ORAL | Status: DC

## 2013-04-23 NOTE — Progress Notes (Signed)
Recreation Therapy Notes  Date: 02.24.2015 Time: 2:45pm Location: [address]   Group Topic: Communication, Team Building, Problem Solving  Goal Area(s) Addresses:  Patient will effectively work with peer towards shared goal.  Patient will identify skill used to make activity successful.  Patient will identify how skills used during activity can be used to reach post d/c goals.   Behavioral Response: Did not attend.   Marykay Lexenise L Emet Rafanan, LRT/CTRS  Flavio Lindroth L 04/23/2013 1:03 PM

## 2013-04-23 NOTE — Progress Notes (Signed)
[facility] Discharge [facility] :  Will you be returning to the same living situation after discharge: No. [facility] screening and possible admission.  At discharge, do you have transportation home?:Yes,  taxi voucher in chart. [facility] time for screening at 8am on 2/26. (taxi voucher given in anticipation for inclement weather.) Do you have the ability to pay for your medications:Yes,  [facility]  Release of information consent forms completed and submitted to Medical Records by CSW. Patient to Follow up at: Follow-up Information   Follow up with [facility]. (Walk in between 8am-9am Monday through Friday for hospital followup/medication management. )    Contact information:   [address] [place], [place] [address] Phone: [phone] Fax: [phone]      Follow up with [facility] On 04/24/2013. ([facility] for [facility] and admission. Make sure to bring [place] ([place]), meds, and clothing. )    Contact information:   [address] [place], [place] [address] Phone: [phone] Fax: [phone]      Patient denies SI/HI:   Yes,  during admission, group, and self report.    Safety Planning and Suicide Prevention discussed:  Yes,  SPE not required for pt. SPI pamphlet provided to pt and he was encouraged to share information with support network, ask questions, and talk about any concerns relating to SPE.  Smart, Stephen Guzman LCSWA  04/23/2013, 12:44 PM

## 2013-04-23 NOTE — Discharge Summary (Signed)
Physician Discharge Summary Note  Patient:  Stephen HartshornDeno Guzman is an 48 y.o., male MRN:  [id number] DOB:  August 05, 1965 Patient phone:  [phone] (home)  Patient address:   [address] [place] [place] [id number],  Total Time spent with patient: Greater than 30 minutes  Date of Admission:  04/20/2013  Date of Discharge: 04/24/13  Reason for Admission:  Alcohol detox  Discharge Diagnoses: Active Problems:   Alcohol dependence   Cocaine dependence   Substance induced mood disorder   Type II or unspecified type diabetes mellitus with unspecified complication, uncontrolled   Psychiatric Specialty Exam: Physical Exam  Constitutional: He is oriented to person, place, and time. He appears well-developed and well-nourished.  HENT:  Head: Normocephalic.  Eyes: Pupils are equal, round, and reactive to light.  Neck: Normal range of motion.  Cardiovascular: Normal rate.   Respiratory: Effort normal.  GI: Soft.  Genitourinary:  Denies any issues in this area  Musculoskeletal: Normal range of motion.  Neurological: He is alert and oriented to person, place, and time.  Skin: Skin is warm and dry.  Psychiatric: His speech is normal and behavior is normal. Judgment and thought content normal. His mood appears not anxious. His affect is not angry, not blunt, not labile and not inappropriate. Cognition and memory are normal. He does not exhibit a depressed mood.    Review of Systems  Constitutional: Negative.   HENT: Negative.   Eyes: Negative.   Respiratory: Negative.   Cardiovascular: Negative.   Gastrointestinal: Negative.   Genitourinary: Negative.   Musculoskeletal: Negative.   Skin: Negative.   Neurological: Negative.   Endo/Heme/Allergies: Negative.   Psychiatric/Behavioral: Positive for memory loss (Stable) and substance abuse (Alcohol/cocaine dependence). Negative for depression, suicidal ideas and hallucinations. The patient does not have insomnia.     Blood pressure 124/81, pulse 85,  temperature 97.4 F (36.3 C), temperature source Oral, resp. rate 19, height 6' (1.829 m), weight 111.131 kg (245 lb).Body mass index is 33.22 kg/(m^2).  General Appearance: Casual and Fairly Groomed  Patent attorneyye Contact::  Good  Speech:  Clear and Coherent  Volume:  Normal  Mood:  Stable  Affect:  Appropriate and Congruent  Thought Process:  Coherent and Intact  Orientation:  Full (Time, Place, and Person)  Thought Content:  Denies any psychotic symptoms  Suicidal Thoughts:  No  Homicidal Thoughts:  No  Memory:  Immediate;   Good Recent;   Good Remote;   Good  Judgement:  Good  Insight:  Present  Psychomotor Activity:  Normal  Concentration:  Good  Recall:  Good  Fund of Knowledge:Good  Language: Good  Akathisia:  No  Handed:  Right  AIMS (if indicated):     Assets:  Desire for Improvement  Sleep:  Number of Hours: 5.75    Past Psychiatric History: Diagnosis:  Alcohol Related Disorder - Severe (303.90), Cocaine  dependence, Substance induced mood disorder,  [facility]  Outpatient Care: [facility]  Substance Abuse Care: [facility]  Self-Mutilation: Denies  Suicidal Attempts: Denies  Violent Behaviors: Denies   Musculoskeletal: Strength & Muscle Tone: within normal limits Gait & Station: normal Patient leans: N/A  DSM5: Schizophrenia Disorders:  NA Obsessive-Compulsive Disorders:  NA Trauma-Stressor Disorders:  NA Substance/Addictive Disorders:  Alcohol Related Disorder - Severe (303.90), Cocaine  dependence Depressive Disorders:  Substance induced mood disorder  Axis Diagnosis:   AXIS I:   Alcohol Related Disorder - Severe (303.90), Cocaine  dependence, Substance induced mood disorder, AXIS II:  Deferred AXIS  III:   Past Medical History  Diagnosis Date  . Diabetes mellitus without complication   . Hypercholesteremia   . Manic depression   . Hypertension    AXIS IV:  Polysubstance dependence AXIS V:  62  Level of Care:   RTC  Hospital Course:   48 Y/O male who states that he "pick up alcohol and cocaine again," 3 months ago. States he has been binging on and off for 9 months. Drinks from 12 to 24 beers, and or liquor, or wine. "Variety drunk". States that the crack cocaine comes together with the alcohol. . States that her sons mother got him for Christmas, and did not bring him back. States he is upset as his son has behavioral issues and the mother drops him whe he is not doing well and when he got to behave better she comes and "snatches" him back.  While a patient in this hospital, Stephen Guzman received [facility] detox protocol for his detox treatment. He was also enrolled in group counseling sessions/activities, including AA/NA meetings being held on this unit to learn coping skills that should help him cope better to manage his substance abuse/dependence issues. He also received medication management for his other pre-existing medical conditions that he presented, including primary care and diabetes consults for his diabetic condition. He tolerated his treatment regimen without any significant adverse effects and or reactions.  Stephen Guzman has completed detox treatment and is currently being discharged to continue substance abuse treatment at the [facility] treatment center. And for medication management and routine psychiatric care, Stephen Guzman will be receiving these services at the [facility] here in [place], [place]. He has been provided with all the pertinent information needed to make this appointment timely. He also received from [facility] a 14 days worth supply samples of his [facility] discharge medications. He left [facility] with all belongings in no apparent distress. Transportation per tax cab.  Consults:  psychiatry  Significant Diagnostic Studies:  labs: Reviewed current lap reports on file, no changes  Discharge Vitals:   Blood pressure 124/81, pulse 85, temperature 97.4 F (36.3 C), temperature source Oral, resp. rate 19, height  6' (1.829 m), weight 111.131 kg (245 lb). Body mass index is 33.22 kg/(m^2). Lab Results:   Results for orders placed during the hospital encounter of 04/20/13 (from the past 72 hour(s))  GLUCOSE, CAPILLARY     Status: Abnormal   Collection Time    04/25/13 12:01 PM      Result Value Ref Range   Glucose-Capillary 535 (*) 70 - 99 mg/dL   Comment 1 Repeat Test    GLUCOSE, CAPILLARY     Status: Abnormal   Collection Time    04/25/13 12:02 PM      Result Value Ref Range   Glucose-Capillary 360 (*) 70 - 99 mg/dL  GLUCOSE, CAPILLARY     Status: Abnormal   Collection Time    04/25/13  5:11 PM      Result Value Ref Range   Glucose-Capillary 420 (*) 70 - 99 mg/dL  GLUCOSE, CAPILLARY     Status: Abnormal   Collection Time    04/25/13  5:12 PM      Result Value Ref Range   Glucose-Capillary 434 (*) 70 - 99 mg/dL  GLUCOSE, CAPILLARY     Status: Abnormal   Collection Time    04/25/13  9:14 PM      Result Value Ref Range   Glucose-Capillary 319 (*) 70 - 99 mg/dL  GLUCOSE, CAPILLARY  Status: Abnormal   Collection Time    04/26/13  6:13 AM      Result Value Ref Range   Glucose-Capillary 221 (*) 70 - 99 mg/dL  GLUCOSE, CAPILLARY     Status: Abnormal   Collection Time    04/26/13 11:33 AM      Result Value Ref Range   Glucose-Capillary 233 (*) 70 - 99 mg/dL  GLUCOSE, CAPILLARY     Status: Abnormal   Collection Time    04/26/13  4:44 PM      Result Value Ref Range   Glucose-Capillary 344 (*) 70 - 99 mg/dL  GLUCOSE, CAPILLARY     Status: Abnormal   Collection Time    04/26/13  9:31 PM      Result Value Ref Range   Glucose-Capillary 333 (*) 70 - 99 mg/dL   Comment 1 Notify RN    GLUCOSE, CAPILLARY     Status: Abnormal   Collection Time    04/27/13  5:58 AM      Result Value Ref Range   Glucose-Capillary 230 (*) 70 - 99 mg/dL   Comment 1 Notify RN     Comment 2 Documented in Chart    GLUCOSE, CAPILLARY     Status: Abnormal   Collection Time    04/27/13 11:44 AM      Result  Value Ref Range   Glucose-Capillary 199 (*) 70 - 99 mg/dL  GLUCOSE, CAPILLARY     Status: Abnormal   Collection Time    04/27/13  5:06 PM      Result Value Ref Range   Glucose-Capillary 261 (*) 70 - 99 mg/dL  GLUCOSE, CAPILLARY     Status: Abnormal   Collection Time    04/27/13  9:25 PM      Result Value Ref Range   Glucose-Capillary 167 (*) 70 - 99 mg/dL  GLUCOSE, CAPILLARY     Status: Abnormal   Collection Time    04/28/13  5:59 AM      Result Value Ref Range   Glucose-Capillary 120 (*) 70 - 99 mg/dL   Comment 1 Notify RN      Physical Findings: AIMS: Facial and Oral Movements Muscles of Facial Expression: None, normal Lips and Perioral Area: None, normal Jaw: None, normal Tongue: None, normal,Extremity Movements Upper (arms, wrists, hands, fingers): None, normal Lower (legs, knees, ankles, toes): None, normal, Trunk Movements Neck, shoulders, hips: None, normal, Overall Severity Severity of abnormal movements (highest score from questions above): None, normal Incapacitation due to abnormal movements: None, normal Patient's awareness of abnormal movements (rate only patient's report): No Awareness, Dental Status Current problems with teeth and/or dentures?: No Does patient usually wear dentures?: No  CIWA:  CIWA-Ar Total: 0 COWS:  COWS Total Score: 2  Psychiatric Specialty Exam: See Psychiatric Specialty Exam and Suicide Risk Assessment completed by Attending Physician prior to discharge.  Discharge destination:  Other:  [facility],   Is patient on multiple antipsychotic therapies at discharge:  No   Has Patient had three or more failed trials of antipsychotic monotherapy by history:  No  Recommended Plan for Multiple Antipsychotic Therapies: NA     Medication List       Indication   gabapentin 100 MG capsule  Commonly known as:  NEURONTIN  Take 1 capsule (100 mg total) by mouth 3 (three) times daily. For substance withdrawal syndrome   Indication:   Substance withdrawal syndrome     glimepiride 2 MG tablet  Commonly known  as:  AMARYL  Take 1 tablet (2 mg total) by mouth daily with breakfast. For diabetes management   Indication:  Type 2 Diabetes     insulin aspart 100 UNIT/ML injection  Commonly known as:  novoLOG  - Sliding scale as directed by medical physician.  - Per Patient uses sliding scale at home   Indication:  Type 2 Diabetes     insulin glargine 100 UNIT/ML injection  Commonly known as:  LANTUS  Inject 0.52 mLs (52 Units total) into the skin at bedtime.   Indication:  Type 2 Diabetes     lisinopril 5 MG tablet  Commonly known as:  PRINIVIL,ZESTRIL  Take 1 tablet (5 mg total) by mouth daily. For hypertension   Indication:  High Blood Pressure     metFORMIN 500 MG tablet  Commonly known as:  GLUCOPHAGE  Take 2 tablets (1,000 mg total) by mouth 2 (two) times daily with a meal. For diabetes   Indication:  Type 2 Diabetes     traZODone 50 MG tablet  Commonly known as:  DESYREL  Take 1 tablet (50 mg total) by mouth at bedtime as needed for sleep.   Indication:  Trouble Sleeping       Follow-up Information   Follow up with [facility]. (Walk in between 8am-9am Monday through Friday for hospital followup/medication management. )    Contact information:   [address], [place] [address] Phone: [phone] Fax: [phone]      Follow up with [facility] On 04/28/2013. (Arrive at facility at [facility] for screening and admission. Make sure to bring Id (with [employer] ID), meds, and clothing. )    Contact information:   [address] [place], [place] [address] Phone: [phone] Fax: [phone]     Follow-up recommendations: Activity:  As tolerated Diet: As recommended by your primary care doctor. Keep all scheduled follow-up appointments as recommended.  Continue to work your relapse prevention plan Comments: Take all your medications as prescribed by your mental healthcare  provider. Report any adverse effects and or reactions from your medicines to your outpatient provider promptly. Patient is instructed and cautioned to not engage in alcohol and or illegal drug use while on prescription medicines. In the event of worsening symptoms, patient is instructed to call the crisis hotline, 911 and or go to the nearest ED for appropriate evaluation and treatment of symptoms. Follow-up with your primary care provider for your other medical issues, concerns and or health care needs.   Total Discharge Time:  Greater than 30 minutes.  Signed: Sanjuana Kava, PMHNP-BC 04/28/2013, 11:55 AM Agree with assessment and plan Reymundo Poll. Dub Mikes, M.D.

## 2013-04-23 NOTE — Progress Notes (Signed)
D: Patient denies SI/HI and A/V hallucinations; patient does not complain of any withdrawal symptoms; patient reports that his blood sugar to come down  A: Monitored q 15 minutes; patient encouraged to attend groups; patient educated about medications; patient given medications per physician orders; patient encouraged to express feelings and/or concerns  R: Patient is cooperative and appropriate to circumstances; patient can be blunted at times; patient's interaction with staff and peers is appropriate; patient was able to set goal to talk with staff 1:1 when having feelings of SI; patient is taking medications as prescribed and tolerating medications; patient is attending some groups

## 2013-04-23 NOTE — [facility] Group Notes (Signed)
[facility] LCSW Group Therapy  04/23/2013 2:31 PM  Type of Therapy:  Group Therapy  Participation Level:  Active  Participation Quality:  Attentive  Affect:  Depressed  Cognitive:  Alert and Oriented  Insight:  Improving  Engagement in Therapy:  Engaged  Modes of Intervention:  Confrontation, Discussion, Education, Exploration, Problem-solving, Rapport Building, Socialization and Support  Summary of Progress/Problems: Emotion Regulation: This group focused on both positive and negative emotion identification and allowed group members to process ways to identify feelings, regulate negative emotions, and find healthy ways to manage internal/external emotions. Group members were asked to reflect on a time when their reaction to an emotion led to a negative outcome and explored how alternative responses using emotion regulation would have benefited them. Group members were also asked to discuss a time when emotion regulation was utilized when a negative emotion was experienced. Omarius was attentive and engaged throughout today's therapy group. He shared that anger is a difficult emotion for him to regulate. Kenichi was able to identify how anger feels to him both physcially and mentally. He shows progress in the group setting and improving insight AEB his ability to process how "worrying about myself and distancing myself from people who aren't in treatment for the right reasons" will help him remain calm and focused. Monta shared that reading and reaching out to "trustworthy people" for emotional support are helpful coping skills for him. Richie identified another pt, CSW, and his wife as supportive people that he feels comfortable reaching out to.    Smart, Lyann Hagstrom LCSWA  04/23/2013, 2:31 PM

## 2013-04-23 NOTE — Progress Notes (Signed)
Adult Psychoeducational Group Note  Date:  04/22/2013 Time: 8:00 PM  Group Topic/Focus:  AA  Participation Level:  Active  Participation Quality:  Monopolizing  Affect:  Excited  Cognitive:  Alert and Oriented  Insight: Good  Engagement in Group:  Distracting and Monopolizing  Modes of Intervention:  Limit-setting  Additional Comments:  Pt frequently interrupted the facilitators, speaking about his experiences in AA. While the pt did tend to have good insight, he had to be constantly redirected.   Humberto SealsWhitaker, Christne Platts Monique 04/23/2013, 12:27 AM

## 2013-04-23 NOTE — [facility] Suicide Risk Assessment (Signed)
Suicide Risk Assessment  Discharge Assessment     Demographic Factors:  Male  Total Time spent with patient: 45 minutes  Psychiatric Specialty Exam:     Blood pressure 135/92, pulse 95, temperature 97.8 F (36.6 C), temperature source Oral, resp. rate 18, height 6' (1.829 m), weight 111.131 kg (245 lb).Body mass index is 33.22 kg/(m^2).  General Appearance: Fairly Groomed  Patent attorneyye Contact::  Fair  Speech:  Clear and Coherent  Volume:  Normal  Mood:  Anxious  Affect:  Appropriate  Thought Process:  Coherent and Goal Directed  Orientation:  Full (Time, Place, and Person)  Thought Content:  worries, concerns, relapse prevention plan  Suicidal Thoughts:  No  Homicidal Thoughts:  No  Memory:  Immediate;   Fair Recent;   Fair Remote;   Fair  Judgement:  Fair  Insight:  Present  Psychomotor Activity:  Normal  Concentration:  Fair  Recall:  [employer]Fair  Fund of Knowledge:Fair  Language: Fair  Akathisia:  No  Handed:    AIMS (if indicated):     Assets:  Desire for Improvement  Sleep:  Number of Hours: 5.75    Musculoskeletal: Strength & Muscle Tone: within normal limits Gait & Station: normal Patient leans: N/A   Mental Status Per Nursing Assessment::   On Admission:     Current Mental Status by Physician: In full contact with reality, there are no active S/S of withdrawal.   Loss Factors: NA  Historical Factors: NA  Risk Reduction Factors:   Sense of responsibility to family and Living with another person, especially a relative  Continued Clinical Symptoms:  Alcohol/Substance Abuse/Dependencies  Cognitive Features That Contribute To Risk:  Closed-mindedness Polarized thinking Thought constriction (tunnel vision)    Suicide Risk:  Minimal: No identifiable suicidal ideation.  Patients presenting with no risk factors but with morbid ruminations; may be classified as minimal risk based on the severity of the depressive symptoms  Discharge Diagnoses:   AXIS I:   Alcohol, Cocaine Dependence, Substance Induced Mood Disorder AXIS II:  Deferred AXIS III:   Past Medical History  Diagnosis Date  . Diabetes mellitus without complication   . Hypercholesteremia   . Manic depression   . Hypertension    AXIS IV:  other psychosocial or environmental problems AXIS V:  61-70 mild symptoms  Plan Of Care/Follow-up recommendations:  Activity:  as tolerated Diet:  regular Follow up [facility] Is patient on multiple antipsychotic therapies at discharge:  No   Has Patient had three or more failed trials of antipsychotic monotherapy by history:  No  Recommended Plan for Multiple Antipsychotic Therapies: NA    Bell Cai A 04/23/2013, 9:47 PM

## 2013-04-23 NOTE — Progress Notes (Signed)
[facility] Progress [facility]  04/23/2013 3:34 PM Stephen HartshornDeno Guzman  MRN:  [id number] Subjective:  Stephen Guzman states that he needs to get his life back together. He is regretful for this last relapse. States that he feels he has a lot to offer and would like to be in the substance abuse field. There are trust issues coming from his wife that he understands. Hope that [facility] will help get his Recovery back on track. Diagnosis:   DSM5: Schizophrenia Disorders:  none Obsessive-Compulsive Disorders:  none Trauma-Stressor Disorders:  none Substance/Addictive Disorders:  Alcohol Related Disorder - Severe (303.90), Cocaine related disorder Depressive Disorders:  Major Depressive Disorder - Mild (296.21) Total Time spent with patient: 30 minutes  Axis I: Substance Induced Mood Disorder  ADL's:  Intact  Sleep: Fair  Appetite:  Fair  Suicidal Ideation:  Plan:  denies Intent:  denies Means:  denies Homicidal Ideation:  Plan:  denies Intent:  denies Means:  denies AEB (as evidenced by):  Psychiatric Specialty Exam: Physical Exam  Review of Systems  Constitutional: Negative.   HENT: Negative.   Eyes: Negative.   Respiratory: Negative.   Cardiovascular: Negative.   Gastrointestinal: Negative.   Genitourinary: Negative.   Musculoskeletal: Negative.   Skin: Negative.   Neurological: Negative.   Endo/Heme/Allergies: Negative.   Psychiatric/Behavioral: Positive for substance abuse. The patient is nervous/anxious.     Blood pressure 132/98, pulse 86, temperature 97.8 F (36.6 C), temperature source Oral, resp. rate 18, height 6' (1.829 m), weight 111.131 kg (245 lb).Body mass index is 33.22 kg/(m^2).  General Appearance: Fairly Groomed  Patent attorneyye Contact::  Fair  Speech:  Clear and Coherent  Volume:  fluctuates  Mood:  Anxious and worries  Affect:  anxious, worried  Thought Process:  Coherent and Goal Directed  Orientation:  Full (Time, Place, and Person)  Thought Content:  worries, concerns,  relapse prevention plan  Suicidal Thoughts:  No  Homicidal Thoughts:  No  Memory:  Immediate;   Fair Recent;   Fair Remote;   Fair  Judgement:  Fair  Insight:  Present  Psychomotor Activity:  Normal  Concentration:  Fair  Recall:  FiservFair  Fund of Knowledge:Fair  Language: Fair  Akathisia:  No  Handed:    AIMS (if indicated):     Assets:  Desire for Improvement  Sleep:  Number of Hours: 5.75   Musculoskeletal: Strength & Muscle Tone: within normal limits Gait & Station: normal Patient leans: N/A  Current Medications: Current Facility-Administered Medications  Medication Dose Route Frequency Provider Last Rate Last Dose  . acetaminophen (TYLENOL) tablet 650 mg  650 mg Oral Q6H PRN Kristeen MansFran E Hobson, NP      . alum & mag hydroxide-simeth (MAALOX/MYLANTA) 200-200-20 MG/5ML suspension 30 mL  30 mL Oral PRN Kristeen MansFran E Hobson, NP      . insulin aspart (novoLOG) injection 0-15 Units  0-15 Units Subcutaneous TID WC Kristeen MansFran E Hobson, NP   11 Units at 04/23/13 1209  . insulin aspart (novoLOG) injection 6 Units  6 Units Subcutaneous TID WC Rachael FeeIrving A Demarkis Gheen, MD   6 Units at 04/23/13 1210  . insulin glargine (LANTUS) injection 20 Units  20 Units Subcutaneous QHS Beau FannyJohn C Withrow, FNP   20 Units at 04/22/13 2219  . lisinopril (PRINIVIL,ZESTRIL) tablet 5 mg  5 mg Oral Daily Rachael FeeIrving A Jeanluc Wegman, MD   5 mg at 04/23/13 [id number]  . living well with diabetes book MISC   Does not apply Once Rachael FeeIrving A Musa Rewerts, MD      .  magnesium hydroxide (MILK OF MAGNESIA) suspension 30 mL  30 mL Oral Daily PRN Kristeen Mans, NP      . metFORMIN (GLUCOPHAGE-XR) 24 hr tablet 500 mg  500 mg Oral BID WC Beau Fanny, FNP   500 mg at 04/23/13 0818  . multivitamin with minerals tablet 1 tablet  1 tablet Oral Daily Rachael Fee, MD   1 tablet at 04/23/13 [phone]  . nicotine (NICODERM CQ - dosed in mg/24 hours) patch 21 mg  21 mg Transdermal Daily Kristeen Mans, NP   21 mg at 04/21/13 0820  . thiamine (VITAMIN B-1) tablet 100 mg  100 mg Oral Daily Rachael Fee, MD   100 mg at 04/23/13 9604  . traZODone (DESYREL) tablet 50 mg  50 mg Oral QHS PRN Kristeen Mans, NP   50 mg at 04/20/13 2238    Lab Results:  Results for orders placed during the hospital encounter of 04/20/13 (from the past 48 hour(s))  GLUCOSE, CAPILLARY     Status: Abnormal   Collection Time    04/21/13  4:53 PM      Result Value Ref Range   Glucose-Capillary 501 (*) 70 - 99 mg/dL   Comment 1 Repeat Test    GLUCOSE, CAPILLARY     Status: Abnormal   Collection Time    04/21/13  5:18 PM      Result Value Ref Range   Glucose-Capillary 536 (*) 70 - 99 mg/dL   Comment 1 Notify RN    GLUCOSE, CAPILLARY     Status: Abnormal   Collection Time    04/21/13  9:38 PM      Result Value Ref Range   Glucose-Capillary 455 (*) 70 - 99 mg/dL   Comment 1 Notify RN    GLUCOSE, CAPILLARY     Status: Abnormal   Collection Time    04/22/13  6:11 AM      Result Value Ref Range   Glucose-Capillary 298 (*) 70 - 99 mg/dL   Comment 1 Notify RN    GLUCOSE, CAPILLARY     Status: Abnormal   Collection Time    04/22/13  9:43 AM      Result Value Ref Range   Glucose-Capillary 357 (*) 70 - 99 mg/dL  GLUCOSE, CAPILLARY     Status: Abnormal   Collection Time    04/22/13 11:55 AM      Result Value Ref Range   Glucose-Capillary 305 (*) 70 - 99 mg/dL  GLUCOSE, CAPILLARY     Status: Abnormal   Collection Time    04/22/13  4:52 PM      Result Value Ref Range   Glucose-Capillary 358 (*) 70 - 99 mg/dL  GLUCOSE, CAPILLARY     Status: Abnormal   Collection Time    04/22/13  9:17 PM      Result Value Ref Range   Glucose-Capillary 367 (*) 70 - 99 mg/dL  GLUCOSE, CAPILLARY     Status: Abnormal   Collection Time    04/23/13  6:02 AM      Result Value Ref Range   Glucose-Capillary 267 (*) 70 - 99 mg/dL  GLUCOSE, CAPILLARY     Status: Abnormal   Collection Time    04/23/13 11:59 AM      Result Value Ref Range   Glucose-Capillary 315 (*) 70 - 99 mg/dL    Physical Findings: AIMS: Facial and  Oral Movements Muscles of Facial Expression: None, normal Lips and  Perioral Area: None, normal Jaw: None, normal Tongue: None, normal,Extremity Movements Upper (arms, wrists, hands, fingers): None, normal Lower (legs, knees, ankles, toes): None, normal, Trunk Movements Neck, shoulders, hips: None, normal, Overall Severity Severity of abnormal movements (highest score from questions above): None, normal Incapacitation due to abnormal movements: None, normal Patient's awareness of abnormal movements (rate only patient's report): No Awareness, Dental Status Current problems with teeth and/or dentures?: No Does patient usually wear dentures?: No  CIWA:  CIWA-Ar Total: 0 COWS:  COWS Total Score: 2  Treatment Plan Summary: Daily contact with patient to assess and evaluate symptoms and progress in treatment Medication management  Plan:  Medical Decision Making Problem Points:  Review of psycho-social stressors (1) Data Points:  Review of medication regiment & side effects (2)  I certify that inpatient services furnished can reasonably be expected to improve the patient's condition.   Rebel Laughridge A 04/23/2013, 3:34 PM

## 2013-04-23 NOTE — Clinical Social Work Note (Addendum)
[facility] tomorrow due to inclement weather. Pt scheduled for d/c at 7am. If [facility] has not [facility] or if noone has reached [facility] to verify that admissions are open, DO NOT D/C PT per Dr. Dub MikesLugo.   The Sherwin-WilliamsHeather Smart, LCSWA 04/23/2013 12:54 PM

## 2013-04-23 NOTE — [facility] Group Notes (Signed)
[facility]   04/23/2013 9:12 AM  Participation Quality:  DID NOT ATTEND-pt in bed sleeping/refused to attend group.    Smart, American FinancialHeather LCSWA

## 2013-04-23 NOTE — Progress Notes (Signed)
Recreation Therapy Notes  Date: 02.25.2015  Time: 2:45pm Location: [address] Dayroom   Group Topic: Understanding mental health   Goal Area(s) Addresses:  Patient will identify benefits to understanding mental health. Patient will work effectively with teammates.   Behavioral Response: Did not attend.    Marykay Lexenise L Aundrey Elahi, LRT/CTRS  Jearl KlinefelterBlanchfield, Petra Dumler L 04/23/2013 4:36 PM

## 2013-04-24 DIAGNOSIS — E118 Type 2 diabetes mellitus with unspecified complications: Secondary | ICD-10-CM

## 2013-04-24 DIAGNOSIS — IMO0002 Reserved for concepts with insufficient information to code with codable children: Secondary | ICD-10-CM | POA: Diagnosis present

## 2013-04-24 DIAGNOSIS — F1020 Alcohol dependence, uncomplicated: Principal | ICD-10-CM

## 2013-04-24 DIAGNOSIS — E1165 Type 2 diabetes mellitus with hyperglycemia: Secondary | ICD-10-CM | POA: Diagnosis present

## 2013-04-24 DIAGNOSIS — F411 Generalized anxiety disorder: Secondary | ICD-10-CM

## 2013-04-24 LAB — GLUCOSE, CAPILLARY
GLUCOSE-CAPILLARY: 266 mg/dL — AB (ref 70–99)
Glucose-Capillary: 402 mg/dL — ABNORMAL HIGH (ref 70–99)
Glucose-Capillary: 434 mg/dL — ABNORMAL HIGH (ref 70–99)
Glucose-Capillary: 365 mg/dL — ABNORMAL HIGH (ref 70–99)
Glucose-Capillary: 364 mg/dL — ABNORMAL HIGH (ref 70–99)

## 2013-04-24 MED ORDER — INSULIN ASPART 100 UNIT/ML ~~LOC~~ SOLN
8.0000 [IU] | Freq: Three times a day (TID) | SUBCUTANEOUS | Status: DC
  Administered 2013-04-24 – 2013-04-25 (×3): 8 [IU] via SUBCUTANEOUS

## 2013-04-24 MED ORDER — INSULIN ASPART 100 UNIT/ML ~~LOC~~ SOLN
10.0000 [IU] | Freq: Once | SUBCUTANEOUS | Status: AC
  Administered 2013-04-24: 10 [IU] via SUBCUTANEOUS

## 2013-04-24 MED ORDER — INSULIN GLARGINE 100 UNIT/ML ~~LOC~~ SOLN: 25.0000 [IU] | Freq: Every day | SUBCUTANEOUS | Status: DC

## 2013-04-24 MED ORDER — GABAPENTIN 100 MG PO CAPS
100.0000 mg | ORAL_CAPSULE | Freq: Three times a day (TID) | ORAL | Status: DC
  Administered 2013-04-24 – 2013-04-27 (×11): 100 mg via ORAL
  Filled 2013-04-24 (×20): qty 1

## 2013-04-24 MED ORDER — INSULIN GLARGINE 100 UNIT/ML ~~LOC~~ SOLN
40.0000 [IU] | Freq: Every day | SUBCUTANEOUS | Status: DC
  Administered 2013-04-24: 40 [IU] via SUBCUTANEOUS

## 2013-04-24 NOTE — Progress Notes (Signed)
[facility] Group Notes:  (Nursing/MHT/Case Management/Adjunct)  Date:  04/24/2013  Time:  2100 Type of Therapy:  wrap up group  Participation Level:  Active  Participation Quality:  Intrusive and Monopolizing  Affect:  Irritable  Cognitive:  Appropriate  Insight:  Lacking  Engagement in Group:  Distracting and Supportive  Modes of Intervention:  Clarification, Education and Support  Summary of Progress/Problems:  Stephen Guzman, Stephen Guzman 04/24/2013, 11:07 PM

## 2013-04-24 NOTE — Progress Notes (Signed)
Did attend group 

## 2013-04-24 NOTE — Progress Notes (Signed)
Recreation Therapy Notes  Date: 02.26.2015 Time: 2:45pm Location: [address] Dayroom   Group Topic: Leisure Education  Goal Area(s) Addresses:  Patient will identify positive leisure activities.  Patient will identify positive emotions associated with leisure participation.  Patient will identify one positive benefit of participation in leisure activities.   Behavioral Response: Engaged, Attentive, Appropriate   Intervention: Game  Activity: LRT selected a letter from a bag, using the selected letter patients were asked to identify as many leisure activities as possible that start with the selected letter in 3 minutes. Patients worked in teams of 4. Last round of game was used to ask patients to identify positive emotions associated with leisure participation.   Education:  Leisure Education, Building control surveyorDischarge Planning, Coping Skills   Education Outcome: Acknowledges understanding  Clinical Observations/Feedback: Patient actively engaged in group activity, working well with teammates and identifying words to contribute to team list. Patient contributed to group discussion identifying benefit of using leisure as a Associate Professorcoping skill.  Marykay Lexenise L Adleigh Mcmasters, LRT/CTRS  Jearl KlinefelterBlanchfield, Yoceline Bazar L 04/24/2013 3:51 PM

## 2013-04-24 NOTE — [facility] Group Notes (Signed)
[facility] Group Notes:  (Nursing/MHT/Case Management/Adjunct)  Date:  04/24/2013  Time:  10:21 AM  Type of Therapy:  Nurse Education  Participation Level:  Active  Participation Quality:  Appropriate  Affect:  Appropriate  Cognitive:  Alert  Insight:  Appropriate  Engagement in Group:  Engaged  Modes of Intervention:  Discussion  Summary of Progress/Problems: Pt states he was introduced to drinking at age 89 and his family drank every weekend, Nicole CellaWebb, Britainy Kozub Guyes 04/24/2013, 10:21 AM

## 2013-04-24 NOTE — H&P (Signed)
PCP:   No PCP Per Patient   Chief Complaint:  Management of DM  HPI: 48 year old male who   has a past medical history of Diabetes mellitus without complication; Hypercholesteremia; Manic depression; and Hypertension. [facility]. Patient has long-standing history of diabetes mellitus, and he was taking Lantus and NovoLog meal coverage at home almost a year ago. As per patient he moved from [place] a year ago, and did not have insurance to [place] the insulin in [place] so he did not use Lantus and NovoLog for more than a year. He has been using metformin as prescribed and usually his blood sugars have been range of 250s to 300s at home. As per patient his blood sugars have gone up since he is admitted. Patient is on carb modified diet. He admits to having increased thirst, increased frequency of urination. Denies chest pain shortness of breath no blurred vision. Patient's last blood glucose was 434 and has been under the range of 300-400.  Allergies:  No Known Allergies    Past Medical History  Diagnosis Date  . Diabetes mellitus without complication   . Hypercholesteremia   . Manic depression   . Hypertension     Past Surgical History  Procedure Laterality Date  . Total hip arthroplasty      Prior to Admission medications   Medication Sig Start Date End Date Taking? Authorizing Provider  insulin glargine (LANTUS) 100 UNIT/ML injection Inject 0.2 mLs (20 Units total) into the skin at bedtime. For diabetes management 04/23/13   Sanjuana KavaAgnes I Nwoko, NP  lisinopril (PRINIVIL,ZESTRIL) 5 MG tablet Take 1 tablet (5 mg total) by mouth daily. For hypertension 04/23/13   Sanjuana KavaAgnes I Nwoko, NP  metFORMIN (GLUCOPHAGE-XR) 500 MG 24 hr tablet Take 1 tablet (500 mg total) by mouth 2 (two) times daily with a meal. For diabetes management 04/23/13   Sanjuana KavaAgnes I Nwoko, NP  traZODone (DESYREL) 50 MG tablet Take 1 tablet (50 mg total) by mouth at bedtime as needed for sleep. 04/23/13    Sanjuana KavaAgnes I Nwoko, NP    Social History:  reports that he has been smoking Cigarettes.  He has been smoking about 0.50 packs per day. He does not have any smokeless tobacco history on file. He reports that he drinks about 14.4 ounces of alcohol per week. He reports that he uses illicit drugs (Cocaine).  History reviewed. No pertinent family history.   All the positives are listed in BOLD  Review of Systems:  HEENT: Headache, blurred vision, runny nose, sore throat Neck: Hypothyroidism, hyperthyroidism,,lymphadenopathy Chest : Shortness of breath, history of COPD, Asthma Heart : Chest pain, history of coronary arterey disease GI:  Nausea, vomiting, diarrhea, constipation, GERD GU: Dysuria, urgency, frequency of urination, hematuria Neuro: Stroke, seizures, syncope Psych: Depression, anxiety, hallucinations   Physical Exam: Blood pressure 142/97, pulse 87, temperature 97.5 F (36.4 C), temperature source Oral, resp. rate 19, height 6' (1.829 m), weight 111.131 kg (245 lb). Constitutional:   Patient is a well-developed and well-nourished *male  in no acute distress and cooperative with exam. Head: Normocephalic and atraumatic Mouth: Mucus membranes moist Eyes: PERRL, EOMI, conjunctivae normal Neck: Supple, No Thyromegaly Cardiovascular: RRR, S1 normal, S2 normal Pulmonary/Chest: CTAB, no wheezes, rales, or rhonchi Abdominal: Soft. Non-tender, non-distended, bowel sounds are normal, no masses, organomegaly, or guarding present.  Neurological: A&O x3, Strenght is normal and symmetric bilaterally, cranial nerve II-XII are grossly intact, no focal motor deficit, sensory intact to light touch bilaterally.  Extremities :  No Cyanosis, Clubbing or Edema   Labs on Admission:  Results for orders placed during the hospital encounter of 04/20/13 (from the past 48 hour(s))  GLUCOSE, CAPILLARY     Status: Abnormal   Collection Time    04/22/13  4:52 PM      Result Value Ref Range    Glucose-Capillary 358 (*) 70 - 99 mg/dL  GLUCOSE, CAPILLARY     Status: Abnormal   Collection Time    04/22/13  9:17 PM      Result Value Ref Range   Glucose-Capillary 367 (*) 70 - 99 mg/dL  GLUCOSE, CAPILLARY     Status: Abnormal   Collection Time    04/23/13  6:02 AM      Result Value Ref Range   Glucose-Capillary 267 (*) 70 - 99 mg/dL  GLUCOSE, CAPILLARY     Status: Abnormal   Collection Time    04/23/13 11:59 AM      Result Value Ref Range   Glucose-Capillary 315 (*) 70 - 99 mg/dL  GLUCOSE, CAPILLARY     Status: Abnormal   Collection Time    04/23/13  5:04 PM      Result Value Ref Range   Glucose-Capillary 380 (*) 70 - 99 mg/dL  GLUCOSE, CAPILLARY     Status: Abnormal   Collection Time    04/23/13  9:19 PM      Result Value Ref Range   Glucose-Capillary 419 (*) 70 - 99 mg/dL  GLUCOSE, CAPILLARY     Status: Abnormal   Collection Time    04/24/13  6:15 AM      Result Value Ref Range   Glucose-Capillary 266 (*) 70 - 99 mg/dL  GLUCOSE, CAPILLARY     Status: Abnormal   Collection Time    04/24/13  2:40 PM      Result Value Ref Range   Glucose-Capillary 434 (*) 70 - 99 mg/dL    Radiological Exams on Admission: No results found.  Assessment/Plan Active Problems:   Alcohol dependence   Cocaine dependence   Substance induced mood disorder  diabetes mellitus  Diabetes mellitus At this time hemoglobin A1c has been ordered, I will follow the results. Patient has not taken Lantus for more than a year, currently is on 25 units of Lantus, I will increase the Lantus to 40 units subcutaneous at bedtime and continue with the NovoLog meal coverage of 8 units along with sliding scale insulin. He is also on metformin 500 twice a day. We'll continue to monitor the patient's blood glucose and adjust accordingly. We'll follow the patient with you       Time Spent  60 minutes  Rifka Ramey S [facility] Pager: [phone] 04/24/2013, 3:58 PM  If 7PM-7AM, please contact  night-coverage  [email or web address]  Password [email or web address]

## 2013-04-24 NOTE — Progress Notes (Signed)
Patient ID: Stephen Guzman, male   DOB: [birth year], 48 y.o.   MRN: [id number]  Pt was pleasant and attended group on the unit. Informed pt of orders from SW.   A:  Support and encouragement was offered. 15 min checks continued for safety.  R: Pt remains safe.

## 2013-04-24 NOTE — [facility] Group Notes (Signed)
[facility] LCSW Group Therapy  04/24/2013 2:37 PM  Type of Therapy:  Group Therapy  Participation Level:  Active  Participation Quality:  Attentive  Affect:  Excited  Cognitive:  Alert and Oriented  Insight:  Improving  Engagement in Therapy:  Engaged and Monopolizing  Modes of Intervention:  Confrontation, Discussion, Education, Exploration, Problem-solving, Rapport Building, Socialization and Support  Summary of Progress/Problems:  Finding Balance in Life. Today's group focused on defining balance in one's own words, identifying things that can knock one off balance, and exploring healthy ways to maintain balance in life. Group members were asked to provide an example of a time when they felt off balance, describe how they handled that situation,and process healthier ways to regain balance in the future. Group members were asked to share the most important tool for maintaining balance that they learned [facility] and how they plan to apply this method after discharge. Newton was attentive and engaged throughout today's therapy group. He shared that for him, acknowledging that his disease makes him "manipulative, a liar, and abnormal" in order to take responsibility for actions and learn how to live with the disease of alcoholism without relapsing. Domonick demonstrates improving insight and progress in the group setting AEB his ability to confront another pt regarding his definition of balance. Koji shared that avoiding triggers, unhealthy people, and [facility] and through his wife will help him reestablish a sense of balance in his life.    Smart, Jyasia Markoff LCSWA  04/24/2013, 2:37 PM

## 2013-04-24 NOTE — Progress Notes (Addendum)
Pt appears very disappointed that he has not left for [facility]. Phoned [facility] at 8:30a to see if he could come and no one answered. Informed pt that [facility]. Pt denies SI and HI and contracts for safety. He rates his depression and hopelessness a 5/10. Pt has been eating good but does state his energy level is very low today. He presently is in the bed. Taijon stated in group that from the age of 87seven years old he has been drinking. His mother and father were alcoholics and he only remembers them drinking all the time so he thought this was just the norm.NP made aware pts. FSBS at 12noon was 402. Per NP pt received 11 units of novologue to cover this. Pt was instructed to drink plenty of water. Pt FSBS was 434 on recheck at 2:45pm. NP aware and pt given a one time dose of 10untis of regular. Internal medicine will be contacted by NP.

## 2013-04-24 NOTE — Progress Notes (Signed)
[facility] MD Progress Note  04/24/2013 4:54 PM Stephen Guzman  MRN:  [id number] Subjective:  States he will like to get some medications for his anxiety, agitation. He has had couple of failed trials with Prozac, Zoloft, Abilify, Seroquel. Did not give them enough time because of side effects. States that even when he had been abstinent before he still felt anxious, agitated. He states that the more he thinks about it the more he knows he has to go to rehab Diagnosis:   DSM5: Schizophrenia Disorders:  Denies Obsessive-Compulsive Disorders:  Denies Trauma-Stressor Disorders:  Denies Substance/Addictive Disorders:  Alcohol Related Disorder - Severe (303.90), Cocaine related disorder Depressive Disorders:  Major Depressive Disorder - Moderate (296.22) Total Time spent with patient: 30 minutes  Axis I: Generalized Anxiety Disorder, Mood Disorder NOS and Substance Induced Mood Disorder  ADL's:  Intact  Sleep: Fair  Appetite:  Fair  Suicidal Ideation:  Plan:  denies Intent:  denies Means:  denies Homicidal Ideation:  Plan:  denies Intent:  denies Means:  denies AEB (as evidenced by):  Psychiatric Specialty Exam: Physical Exam  Review of Systems  Constitutional: Positive for malaise/fatigue.  HENT: Negative.   Eyes: Negative.   Respiratory: Negative.   Cardiovascular: Negative.   Gastrointestinal: Negative.   Genitourinary: Negative.   Musculoskeletal: Negative.   Skin: Negative.   Neurological: Negative.   Endo/Heme/Allergies: Negative.   Psychiatric/Behavioral: Positive for substance abuse. The patient is nervous/anxious.     Blood pressure 142/97, pulse 87, temperature 97.5 F (36.4 C), temperature source Oral, resp. rate 19, height 6' (1.829 m), weight 111.131 kg (245 lb).Body mass index is 33.22 kg/(m^2).  General Appearance: Fairly Groomed  Patent attorney::  Fair  Speech:  Clear and Coherent  Volume:  fluctuates  Mood:  Anxious and Depressed  Affect:  anxious, depressed   Thought Process:  Coherent and Goal Directed  Orientation:  Full (Time, Place, and Person)  Thought Content:  symtpoms, worries, concerns  Suicidal Thoughts:  No  Homicidal Thoughts:  No  Memory:  Immediate;   Fair Recent;   Fair Remote;   Fair  Judgement:  Fair  Insight:  Present  Psychomotor Activity:  Restlessness  Concentration:  Fair  Recall:  [employer] of Knowledge:Fair  Language: Fair  Akathisia:  No  Handed:    AIMS (if indicated):     Assets:  Desire for Improvement Housing  Sleep:  Number of Hours: 5.75   Musculoskeletal: Strength & Muscle Tone: within normal limits Gait & Station: normal Patient leans: N/A  Current Medications: Current Facility-Administered Medications  Medication Dose Route Frequency Provider Last Rate Last Dose  . acetaminophen (TYLENOL) tablet 650 mg  650 mg Oral Q6H PRN Kristeen Mans, NP      . alum & mag hydroxide-simeth (MAALOX/MYLANTA) 200-200-20 MG/5ML suspension 30 mL  30 mL Oral PRN Kristeen Mans, NP      . gabapentin (NEURONTIN) capsule 100 mg  100 mg Oral TID Rachael Fee, MD   100 mg at 04/24/13 1212  . insulin aspart (novoLOG) injection 0-15 Units  0-15 Units Subcutaneous TID WC Kristeen Mans, NP   11 Units at 04/24/13 1215  . insulin aspart (novoLOG) injection 8 Units  8 Units Subcutaneous TID WC Sanjuana Kava, NP      . insulin glargine (LANTUS) injection 40 Units  40 Units Subcutaneous QHS Meredeth Ide, MD      . lisinopril (PRINIVIL,ZESTRIL) tablet 5 mg  5 mg Oral Daily Madie Reno  Jorja Loa, MD   5 mg at 04/24/13 0841  . living well with diabetes book MISC   Does not apply Once Rachael Fee, MD      . magnesium hydroxide (MILK OF MAGNESIA) suspension 30 mL  30 mL Oral Daily PRN Kristeen Mans, NP      . metFORMIN (GLUCOPHAGE-XR) 24 hr tablet 500 mg  500 mg Oral BID WC Beau Fanny, FNP   500 mg at 04/24/13 1610  . multivitamin with minerals tablet 1 tablet  1 tablet Oral Daily Rachael Fee, MD   1 tablet at 04/24/13 [phone]  .  nicotine (NICODERM CQ - dosed in mg/24 hours) patch 21 mg  21 mg Transdermal Daily Kristeen Mans, NP   21 mg at 04/21/13 0820  . thiamine (VITAMIN B-1) tablet 100 mg  100 mg Oral Daily Rachael Fee, MD   100 mg at 04/24/13 5409  . traZODone (DESYREL) tablet 50 mg  50 mg Oral QHS PRN Kristeen Mans, NP   50 mg at 04/20/13 2238    Lab Results:  Results for orders placed during the hospital encounter of 04/20/13 (from the past 48 hour(s))  GLUCOSE, CAPILLARY     Status: Abnormal   Collection Time    04/22/13  9:17 PM      Result Value Ref Range   Glucose-Capillary 367 (*) 70 - 99 mg/dL  GLUCOSE, CAPILLARY     Status: Abnormal   Collection Time    04/23/13  6:02 AM      Result Value Ref Range   Glucose-Capillary 267 (*) 70 - 99 mg/dL  GLUCOSE, CAPILLARY     Status: Abnormal   Collection Time    04/23/13 11:59 AM      Result Value Ref Range   Glucose-Capillary 315 (*) 70 - 99 mg/dL  GLUCOSE, CAPILLARY     Status: Abnormal   Collection Time    04/23/13  5:04 PM      Result Value Ref Range   Glucose-Capillary 380 (*) 70 - 99 mg/dL  GLUCOSE, CAPILLARY     Status: Abnormal   Collection Time    04/23/13  9:19 PM      Result Value Ref Range   Glucose-Capillary 419 (*) 70 - 99 mg/dL  GLUCOSE, CAPILLARY     Status: Abnormal   Collection Time    04/24/13  6:15 AM      Result Value Ref Range   Glucose-Capillary 266 (*) 70 - 99 mg/dL  GLUCOSE, CAPILLARY     Status: Abnormal   Collection Time    04/24/13  2:40 PM      Result Value Ref Range   Glucose-Capillary 434 (*) 70 - 99 mg/dL    Physical Findings: AIMS: Facial and Oral Movements Muscles of Facial Expression: None, normal Lips and Perioral Area: None, normal Jaw: None, normal Tongue: None, normal,Extremity Movements Upper (arms, wrists, hands, fingers): None, normal Lower (legs, knees, ankles, toes): None, normal, Trunk Movements Neck, shoulders, hips: None, normal, Overall Severity Severity of abnormal movements (highest  score from questions above): None, normal Incapacitation due to abnormal movements: None, normal Patient's awareness of abnormal movements (rate only patient's report): No Awareness, Dental Status Current problems with teeth and/or dentures?: No Does patient usually wear dentures?: No  CIWA:  CIWA-Ar Total: 0 COWS:  COWS Total Score: 2  Treatment Plan Summary: Daily contact with patient to assess and evaluate symptoms and progress in treatment Medication management  Plan:  Supportive approach/coping skills/relapse prevention           Trial with Neurontin 100 mg TID with plans to optimize the dosage/response  Medical Decision Making Problem Points:  Review of last therapy session (1) and Review of psycho-social stressors (1) Data Points:  Review of medication regiment & side effects (2)  I certify that inpatient services furnished can reasonably be expected to improve the patient's condition.   Emberley Kral A 04/24/2013, 4:54 PM

## 2013-04-24 NOTE — Progress Notes (Addendum)
D: pt is calm and cooperative. Denies si/hi/avh. Denies pain. Pt c/o of pacing, being restless, and groggy due to medications. Pt stated he will be up and energetic at one moment and the next barely able to keep his eyes open. Pt stated he is just " ready to go home." pt cbg 364 this evening A: 1:1 time given. q 15 min safety checks.  R: pt remains safe on unit. No further complaints at this time

## 2013-04-25 LAB — GLUCOSE, CAPILLARY
GLUCOSE-CAPILLARY: 221 mg/dL — AB (ref 70–99)
GLUCOSE-CAPILLARY: 360 mg/dL — AB (ref 70–99)
GLUCOSE-CAPILLARY: 434 mg/dL — AB (ref 70–99)
Glucose-Capillary: 535 mg/dL — ABNORMAL HIGH (ref 70–99)
Glucose-Capillary: 420 mg/dL — ABNORMAL HIGH (ref 70–99)
Glucose-Capillary: 319 mg/dL — ABNORMAL HIGH (ref 70–99)

## 2013-04-25 LAB — HEMOGLOBIN A1C
HEMOGLOBIN A1C: 10.5 % — AB (ref <5.7)
MEAN PLASMA GLUCOSE: 255 mg/dL — AB (ref <117)

## 2013-04-25 MED ORDER — METFORMIN HCL 500 MG PO TABS
1000.0000 mg | ORAL_TABLET | Freq: Two times a day (BID) | ORAL | Status: DC
  Administered 2013-04-25 – 2013-04-27 (×5): 1000 mg via ORAL
  Filled 2013-04-25 (×8): qty 2

## 2013-04-25 MED ORDER — INSULIN GLARGINE 100 UNIT/ML ~~LOC~~ SOLN
45.0000 [IU] | Freq: Every day | SUBCUTANEOUS | Status: DC
  Administered 2013-04-25: 45 [IU] via SUBCUTANEOUS

## 2013-04-25 MED ORDER — INSULIN ASPART 100 UNIT/ML ~~LOC~~ SOLN
10.0000 [IU] | Freq: Three times a day (TID) | SUBCUTANEOUS | Status: DC
  Administered 2013-04-25 – 2013-04-28 (×8): 10 [IU] via SUBCUTANEOUS

## 2013-04-25 NOTE — Progress Notes (Signed)
[facility] Discharge Plan :  Will you be returning to the same living situation after discharge: No.Screening and [facility].  At discharge, do you have transportation home?:Yes,  cab voucher in chart. he must d/c by 7am on Monday  Do you have the ability to pay for your medications:Yes,  [place] medicaid  Release of information consent forms completed and submitted to Medical Records by CSW. Patient to Follow up at: Follow-up Information   Follow up with [facility]. (Walk in between 8am-9am Monday through Friday for hospital followup/medication management. )    Contact information:   [address] [place] [place], [place] [address] Phone: [phone] Fax: [phone]      Follow up with [facility] On 04/28/2013. (Arrive at facility at [facility] for screening and admission. Make sure to [place] meds, and clothing. )    Contact information:   [address] [place], [place] [address] Phone: [phone] Fax: [phone]      Patient denies SI/HI:   Yes,  during admission, group, and self report.     Safety Planning and Suicide Prevention discussed:  Yes,  SPE not required for this pt. SPI pamphlet provided to pt and he was encouraged to share information with support network, ask questions, and talk about any concerns relating to SPE.  Smart, Teofil Maniaci LCSWA  04/25/2013, 2:59 PM

## 2013-04-25 NOTE — Progress Notes (Signed)
D: Patient in the dayroom on first approach.  Patient states he is anxious to be discharged but worried about his continuous high blood sugars.  Patient states he does not understand why his blood sugars continue to be high.  Patient states he is eating the food provided in the cafeteria but when he talked to writer about what his choices actually were.  Patient was educated on  foods that are available in the cafeteria that are geared more toward his carb-modified diet.  Patient states he already knows this but states, "I mean, I haven't been eating seconds and they are still high." Writer also talked to patient about when he is discharged checking his blood sugars regularly so he can know what his blood sugars are.  Patient states he is ready to be discharged and cannot wait [facility].  Patient denies SI/HI and denies AVH.  Patient had a disagreement with one of the AA group leaders today but it was resolved and patient states everything is a learning experience.  Patient appears to have insight tonight. A: Staff to monitor Q 15 mins for safety.  Encouragement and support offered.  Scheduled medications administered per orders.  Trazodone administered prn for sleep. R: Patient remains safe on the unit.  Patient attended group tonight.  Patient visible on the unit and interacting with peers.  Patient taking administered medications.

## 2013-04-25 NOTE — Progress Notes (Signed)
Patient ID: Stephen Guzman, male   DOB: 01/07/1966, 48 y.o.   MRN: [id number] [facility] Progress [facility]  04/25/2013 3:39 PM Stephen Guzman  MRN:  [id number] Subjective:  Son is awaiting a rehab bed.  The diabetic consultant came today to review his medications with adjustments for better control of his blood glucose.  His depression has improved to a 5/10 and 3/10 hopelessness.  He has been attending groups with participation.  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   HENT: Negative.   Eyes: Negative.   Respiratory: Negative.   Cardiovascular: Negative.   Gastrointestinal: Negative.   Genitourinary: Negative.   Musculoskeletal: Negative.   Skin: Negative.   Neurological: Negative.   Endo/Heme/Allergies: Negative.   Psychiatric/Behavioral: Positive for substance abuse. The patient is nervous/anxious.     Blood pressure 130/91, pulse 80, temperature 98.1 F (36.7 C), temperature source Oral, resp. rate 16, height 6' (1.829 m), weight 111.131 kg (245 lb).Body mass index is 33.22 kg/(m^2).  General Appearance: Fairly Groomed  Patent attorneyye Contact::  Fair  Speech:  Clear and Coherent  Volume:  Normal  Mood:  Anxious and Depressed  Affect:  anxious, depressed  Thought Process:  Coherent and Goal Directed   Orientation:  Full (Time, Place, and Person)  Thought Content:  WDL  Suicidal Thoughts:  No  Homicidal Thoughts:  No  Memory:  Immediate;   Fair Recent;   Fair Remote;   Fair  Judgement:  Fair  Insight:  Present  Psychomotor Activity:  Normal  Concentration:  Fair  Recall:  [employer]Fair  Fund of Knowledge:Fair  Language: Fair  Akathisia:  No  Handed:    AIMS (if indicated):     Assets:  Desire for Improvement Housing  Sleep:  Number of Hours: 6.25   Musculoskeletal: Strength & Muscle Tone: within normal limits Gait & Station: normal Patient leans: N/A  Current Medications: Current Facility-Administered Medications  Medication Dose Route Frequency Provider Last Rate Last Dose  . acetaminophen (TYLENOL) tablet 650 mg  650 mg Oral Q6H PRN Kristeen MansFran E Hobson, NP      . alum & mag hydroxide-simeth (MAALOX/MYLANTA) 200-200-20 MG/5ML suspension 30 mL  30 mL Oral PRN Kristeen MansFran E Hobson, NP      . gabapentin (NEURONTIN) capsule 100 mg  100 mg Oral TID Rachael FeeIrving A Lugo, MD   100 mg at 04/25/13 1210  . insulin aspart (novoLOG) injection 0-15 Units  0-15 Units Subcutaneous TID WC Kristeen MansFran E Hobson, NP   15 Units at 04/25/13 1211  . insulin aspart (novoLOG) injection 10 Units  10 Units Subcutaneous TID WC Jeralyn BennettEzequiel Zamora, MD      . insulin glargine (LANTUS) injection 45 Units  45 Units Subcutaneous QHS Jeralyn BennettEzequiel Zamora, MD      . lisinopril (PRINIVIL,ZESTRIL) tablet 5 mg  5 mg Oral Daily Rachael FeeIrving A Lugo, MD   5 mg at 04/25/13 [phone]  .  living well with diabetes book MISC   Does not apply Once Rachael Fee, MD      . magnesium hydroxide (MILK OF MAGNESIA) suspension 30 mL  30 mL Oral Daily PRN Kristeen Mans, NP      . metFORMIN (GLUCOPHAGE) tablet 1,000 mg  1,000 mg Oral BID WC Jeralyn Bennett, MD      . multivitamin with minerals tablet 1 tablet  1 tablet Oral Daily Rachael Fee, MD   1 tablet at 04/25/13 [phone]  . thiamine (VITAMIN B-1) tablet 100 mg  100 mg Oral Daily Rachael Fee, MD   100 mg at 04/25/13 9604  .  traZODone (DESYREL) tablet 50 mg  50 mg Oral QHS PRN Kristeen Mans, NP   50 mg at 04/24/13 2142    Lab Results:  Results for orders placed during the hospital encounter of 04/20/13 (from the past 48 hour(s))  GLUCOSE, CAPILLARY     Status: Abnormal   Collection Time    04/23/13  5:04 PM      Result Value Ref Range   Glucose-Capillary 380 (*) 70 - 99 mg/dL  GLUCOSE, CAPILLARY     Status: Abnormal   Collection Time    04/23/13  9:19 PM      Result Value Ref Range   Glucose-Capillary 419 (*) 70 - 99 mg/dL  GLUCOSE, CAPILLARY     Status: Abnormal   Collection Time    04/24/13  6:15 AM      Result Value Ref Range   Glucose-Capillary 266 (*) 70 - 99 mg/dL  GLUCOSE, CAPILLARY     Status: Abnormal   Collection Time    04/24/13 12:04 PM      Result Value Ref Range   Glucose-Capillary 402 (*) 70 - 99 mg/dL  GLUCOSE, CAPILLARY     Status: Abnormal   Collection Time    04/24/13  2:40 PM      Result Value Ref Range   Glucose-Capillary 434 (*) 70 - 99 mg/dL  GLUCOSE, CAPILLARY     Status: Abnormal   Collection Time    04/24/13  4:55 PM      Result Value Ref Range   Glucose-Capillary 365 (*) 70 - 99 mg/dL   Comment 1 Documented in Chart     Comment 2 Notify RN    HEMOGLOBIN A1C     Status: Abnormal   Collection Time    04/24/13  7:42 PM      Result Value Ref Range   Hemoglobin A1C 10.5 (*) <5.7 %   Comment: (NOTE)                                                                               According to the ADA Clinical Practice Recommendations for 2011, when     HbA1c is used as a screening test:      >=6.5%   Diagnostic of Diabetes Mellitus               (if abnormal result is confirmed)     5.7-6.4%   Increased risk of developing Diabetes Mellitus     References:Diagnosis and Classification of Diabetes Mellitus,Diabetes  ZOXW,9604,54(UJWJX 1):S62-S69 and Standards of Medical Care in             Diabetes - 2011,Diabetes Care,2011,34 (Suppl 1):S11-S61.   Mean Plasma Glucose  255 (*) <117 mg/dL   Comment: Performed at [facility]  GLUCOSE, CAPILLARY     Status: Abnormal   Collection Time    04/24/13  9:15 PM      Result Value Ref Range   Glucose-Capillary 364 (*) 70 - 99 mg/dL   Comment 1 Notify RN     Comment 2 Documented in Chart    GLUCOSE, CAPILLARY     Status: Abnormal   Collection Time    04/25/13  6:22 AM      Result Value Ref Range   Glucose-Capillary 221 (*) 70 - 99 mg/dL  GLUCOSE, CAPILLARY     Status: Abnormal   Collection Time    04/25/13 12:01 PM      Result Value Ref Range   Glucose-Capillary 535 (*) 70 - 99 mg/dL   Comment 1 Repeat Test    GLUCOSE, CAPILLARY     Status: Abnormal   Collection Time    04/25/13 12:02 PM      Result Value Ref Range   Glucose-Capillary 360 (*) 70 - 99 mg/dL    Physical Findings: AIMS: Facial and Oral Movements Muscles of Facial Expression: None, normal Lips and Perioral Area: None, normal Jaw: None, normal Tongue: None, normal,Extremity Movements Upper (arms, wrists, hands, fingers): None, normal Lower (legs, knees, ankles, toes): None, normal, Trunk Movements Neck, shoulders, hips: None, normal, Overall Severity Severity of abnormal movements (highest score from questions above): None, normal Incapacitation due to abnormal movements: None, normal Patient's awareness of abnormal movements (rate only patient's report): No Awareness, Dental Status Current problems with teeth and/or dentures?: No Does patient usually wear dentures?: No  CIWA:  CIWA-Ar Total: 0 COWS:  COWS Total Score: 2  Treatment Plan Summary: Daily contact with patient to assess and evaluate symptoms and progress in treatment Medication management  Plan:  Review of chart, vital signs, medications, and notes. 1-Individual and group therapy 2-Medication management for depression and anxiety:  Medications reviewed with the patient and he stated no untoward effects, no changes made 3-Coping skills for depression,  anxiety 4-Continue crisis stabilization and management 5-Address health issues--monitoring vital signs, stable--Insulin increased from 8 units to 10 units with meals and his lantus was increased to 45 mg tonight, Glucophage 1000 mg daily 6-Treatment plan in progress to prevent relapse of depression and anxiety  Medical Decision Making Problem Points:  Review of last therapy session (1) and Review of psycho-social stressors (1) Data Points:  Review of medication regiment & side effects (2)  I certify that inpatient services furnished can reasonably be expected to improve the patient's condition.   Nanine Means, PMH-NP 04/25/2013, 3:39 PM  Reviewed the information documented and agree with the treatment plan.  Haili Donofrio,JANARDHAHA R. 04/25/2013 6:28 PM

## 2013-04-25 NOTE — [facility] Group Notes (Signed)
[facility] LCSW Group Therapy  04/25/2013 2:58 PM  Type of Therapy:  Group Therapy  Participation Level:  None  Participation Quality:  Drowsy  Affect:  Depressed, Flat and Lethargic  Cognitive:  Lacking  Insight:  None  Engagement in Therapy:  None  Modes of Intervention:  Confrontation, Discussion, Education, Exploration, Problem-solving, Rapport Building, Socialization and Support  Summary of Progress/Problems: Feelings around Relapse. Group members discussed the meaning of relapse and shared personal stories of relapse, how it affected them and others, and how they perceived themselves during this time. Group members were encouraged to identify triggers, warning signs and coping skills used when facing the possibility of relapse. Social supports were discussed and explored in detail. Post Acute Withdrawal Syndrome (handout provided) was introduced and examined. Pt's were encouraged to ask questions, talk about key points associated with PAWS, and process this information in terms of relapse prevention. Stephen Guzman fell asleep immediately and did not participate in group discussion. He does not show progress in the group setting at this time due to drowsiness and inability to remain awake.    Smart, Nilesh Stegall LCSWA  04/25/2013, 2:58 PM

## 2013-04-25 NOTE — Progress Notes (Signed)
Patient CBG 535 when checked but repeated test and was 360 the second time. Patient asymptomatic of 535 thus 360 probably more accurate for him. Sliding scale and meal coverage given. Internal medicine following him.

## 2013-04-25 NOTE — Progress Notes (Signed)
NP notified about patient blood sugar prior to dinner. Patient given 25 units of novolog. Internal medicine following patient also. No new orders at this time. Metformin, Lantus, and meal coverage insulin just increased after lunch today by internal medicine.

## 2013-04-25 NOTE — Tx Team (Signed)
Interdisciplinary Treatment Plan Update (Adult)  Date: 04/25/2013   Time Reviewed: 10:54 AM  Progress in Treatment:  Attending groups: Yes  Participating in groups: Yes  Taking medication as prescribed: Yes  Tolerating medication: Yes  Family/Significant othe contact made: No. SPE not required for pt.  Patient understands diagnosis: Yes, AEB seeking treatment for ETOH detox, cocaine abuse, and mood stabilization.  Discussing patient identified problems/goals with staff: Yes  Medical problems stabilized or resolved: Yes  Denies suicidal/homicidal ideation: Yes during admission, self report.  Patient has not harmed self or Others: Yes  New problem(s) identified:  Discharge Plan or Barriers: [facility]. [facility]. 14 day med supply in chart. MUST HAVE SRA COMPLETED BY Sunday AFTER 3PM!  Taxi voucher also in chart.  Additional comments:  N/a  Reason for Continuation of Hospitalization: Medication management  Estimated length of stay: 3 days-d/c Monday by 7am. Taxi voucher in chart.  For review of initial/current patient goals, please see plan of care.  Attendees:  Patient:    Family:    Physician: 04/25/2013 10:54 AM   Nursing:  Victorino DikeJennifer RN 04/25/2013 10:55 AM   Clinical Social Worker Yakir Wenke Smart, LCSWA  04/25/2013 10:54 AM   Other: Alexia FreestonePatty RN 04/25/2013 10:55 AM   Other: Chandra BatchAggie N. PA 04/25/2013 10:55 AM   Other:    Other:    Scribe for Treatment Team:  The Sherwin-WilliamsHeather Smart LCSWA 04/25/2013 10:54 AM

## 2013-04-25 NOTE — Progress Notes (Signed)
Patient ID: Stephen Guzman, male   DOB: Apr 11, 1965, 48 y.o.   MRN: [id number]  D: Patient pleasant on approach this am. Reports depression "5" and hopelessness "3" on scale. Currently denies any SI at this time. Patient was worried about what the physician that came to him last night for his DM was going to do. Went over recommendations with patient at this time. Encouraged patient to make better choices while here in the dining room and at snack time.  A: Staff will monitor on q 15 minute checks, follow treatment plan, and give meds as ordered. R: Cooperative on the unit

## 2013-04-25 NOTE — Progress Notes (Signed)
[facility] PROGRESS NOTE  Stephen Guzman ZOX:[id number] DOB: Aug 17, 1965 DOA: 04/20/2013 PCP: No PCP Per Patient  Assessment/Plan: 1. Poorly controlled type 2 diabetes mellitus, with hemoglobin A1c of 10.5 obtained on 04/24/2013. His blood sugars remain elevated, appearing to fluctuate between 200 and 20/50. I have increased his metformin dose to 1000 mg by mouth 3 times a day, along with increasing his Lantus to 45 units subcutaneous each bedtime. Recommend continuing Accu-Cheks q. a.c. and each bedtime along with sliding scale coverage if needed. Patient will require close followup of his blood sugars upon discharge. He states not having a PCP in the area. Would recommend social work consult to assist with setting him up with a physician in the community he may follow his diabetes.  Code Status: Full Code Family Communication: Family not present Disposition Plan: Adjusting insulin regimen    HPI/Subjective: Patient is a 48 year old gentleman with a past medical history of poorly controlled type 2 diabetes mellitus, admitted to the behavioral health center for treatment of depression. During this inpatient stay he has had elevated blood sugars in the 20/50 to 350s range. Initially his oral abscess was increased from 25 units subcutaneous daily 240 units subcutaneous daily with the addition of NovoLog for meal coverage at 8 units 3 times a day. His metformin was continued at 500 mg by mouth twice a day. On 04/25/2013 his blood sugars were reviewed, and he continued to be elevated with blood sugars ranging from 222 to 350. Slight scale coverage was also provided with Accu-Cheks q. A.c. and his most recent blood sugar was 360.  Objective: Filed Vitals:   04/25/13 0701  BP: 130/91  Pulse: 80  Temp:   Resp:    No intake or output data in the 24 hours ending 04/25/13 1215 Filed Weights   04/20/13 0934  Weight: 111.131 kg (245 lb)    Exam:   General:  No acute distress, awake alert  oriented   Cardiovascular: Regular rate and rhythm normal S1-S2  Respiratory: Clear to auscultation bilaterally no wheezing rhonchi or rales  Abdomen: Soft nontender nondistended  Musculoskeletal: No edema  Data Reviewed: Basic Metabolic Panel:  Recent Labs Lab 04/20/13 0216  NA 135*  K 3.7  CL 98  CO2 23  GLUCOSE 258*  BUN 9  CREATININE 0.76  CALCIUM 8.8   Liver Function Tests:  Recent Labs Lab 04/20/13 0216  AST 41*  ALT 56*  ALKPHOS 88  BILITOT 0.5  PROT 7.8  ALBUMIN 3.4*   No results found for this basename: LIPASE, AMYLASE,  in the last 168 hours No results found for this basename: AMMONIA,  in the last 168 hours CBC:  Recent Labs Lab 04/20/13 0216  WBC 11.6*  NEUTROABS 5.4  HGB 15.7  HCT 43.4  MCV 90.8  PLT 194   Cardiac Enzymes: No results found for this basename: CKTOTAL, CKMB, CKMBINDEX, TROPONINI,  in the last 168 hours BNP (last 3 results) No results found for this basename: PROBNP,  in the last 8760 hours CBG:  Recent Labs Lab 04/24/13 1655 04/24/13 2115 04/25/13 0622 04/25/13 1201 04/25/13 1202  GLUCAP 365* 364* 221* 535* 360*    No results found for this or any previous visit (from the past 240 hour(s)).   Studies: No results found.  Scheduled Meds: . gabapentin  100 mg Oral TID  . insulin aspart  0-15 Units Subcutaneous TID WC  . insulin aspart  10 Units Subcutaneous TID WC  . insulin glargine  40 Units  Subcutaneous QHS  . lisinopril  5 mg Oral Daily  . living well with diabetes book   Does not apply Once  . metFORMIN  1,000 mg Oral BID WC  . multivitamin with minerals  1 tablet Oral Daily  . thiamine  100 mg Oral Daily   Continuous Infusions:   Active Problems:   Alcohol dependence   Cocaine dependence   Substance induced mood disorder   Type II or unspecified type diabetes mellitus with unspecified complication, uncontrolled    Time spent: 35 minutes    Jeralyn BennettZAMORA, Gilles Trimpe  [facility] Pager  [phone]. If 7PM-7AM, please contact night-coverage at [email or web address], password [email or web address] 04/25/2013, 12:15 PM  LOS: 5 days

## 2013-04-25 NOTE — Progress Notes (Signed)
Recreation Therapy Notes   Date: 02.27.2015 Time: 2:45pm Location: 100 [address] Dayroom    Group Topic: Communication, Team Building, Problem Solving  Goal Area(s) Addresses:  Patient will effectively work with peer towards shared goal.  Patient will identify skill used to make activity successful.  Patient will identify how skills used during activity can be used to build healthy support system.   Behavioral Response: Engaged, Attentive  Intervention: Problem Solving Activity  Activity: [place]. Patients were provided the following materials: 5 drinking straws, 5 rubber bands, 5 paper clips, 2 index cards, 2 drinking cups, and 2 toilet paper rolls. Using the provided materials patients were asked to build a launching mechanisms to launch a ping pong ball approximately 12 feet. Patients were divided into teams of 3-5.   Education: Pharmacist, communityocial Skills, Building control surveyorDischarge Planning.   Education Outcome: Acknowledges understanding   Clinical Observations/Feedback: Patient actively engaged in group activity, working well with team mates and assisting with Holiday representativeconstruction of teams launching mechanism. Patient identified effectively communication use by his team. Patient made no additional contributions to group discussion, but appeared to actively listen as he maintained appropriate eye contact with speaker.   Marykay Lexenise L Inocencia Murtaugh, LRT/CTRS  Jearl KlinefelterBlanchfield, [place] Coffin L 04/25/2013 3:54 PM

## 2013-04-25 NOTE — [facility] Group Notes (Signed)
[facility]   04/25/2013 9:31 AM  Participation Quality:  Appropriate   Mood/Affect:  Appropriate  Depression Rating:  5  Anxiety Rating:  5  Thoughts of Suicide:  No Will you contract for safety?   NA  Current AVH:  No  Plan for Discharge/Comments:  Pt informed that CSW spoke with [facility] screening/admission date scheduled for Monday at 8am. Taxi voucher in chart along with 14 day med supply.  Transportation Means: taxi   Supports: wife; some family supports identified.   Stephen Guzman, American FinancialHeather LCSWA

## 2013-04-26 DIAGNOSIS — F39 Unspecified mood [affective] disorder: Secondary | ICD-10-CM

## 2013-04-26 DIAGNOSIS — F321 Major depressive disorder, single episode, moderate: Secondary | ICD-10-CM

## 2013-04-26 DIAGNOSIS — F1410 Cocaine abuse, uncomplicated: Secondary | ICD-10-CM

## 2013-04-26 LAB — GLUCOSE, CAPILLARY
GLUCOSE-CAPILLARY: 344 mg/dL — AB (ref 70–99)
Glucose-Capillary: 221 mg/dL — ABNORMAL HIGH (ref 70–99)
Glucose-Capillary: 233 mg/dL — ABNORMAL HIGH (ref 70–99)

## 2013-04-26 MED ORDER — INSULIN ASPART 100 UNIT/ML ~~LOC~~ SOLN
0.0000 [IU] | Freq: Every day | SUBCUTANEOUS | Status: DC
  Administered 2013-04-26: 4 [IU] via SUBCUTANEOUS

## 2013-04-26 MED ORDER — METFORMIN HCL 500 MG PO TABS
1000.0000 mg | ORAL_TABLET | Freq: Two times a day (BID) | ORAL | Status: DC
  Filled 2013-04-26: qty 56

## 2013-04-26 MED ORDER — INSULIN ASPART 100 UNIT/ML ~~LOC~~ SOLN
0.0000 [IU] | Freq: Three times a day (TID) | SUBCUTANEOUS | Status: DC
  Administered 2013-04-26: 15 [IU] via SUBCUTANEOUS
  Administered 2013-04-27: 4 [IU] via SUBCUTANEOUS
  Administered 2013-04-27: 11 [IU] via SUBCUTANEOUS
  Administered 2013-04-27: 7 [IU] via SUBCUTANEOUS

## 2013-04-26 MED ORDER — LISINOPRIL 5 MG PO TABS
5.0000 mg | ORAL_TABLET | Freq: Every day | ORAL | Status: DC
  Filled 2013-04-26: qty 14

## 2013-04-26 MED ORDER — GABAPENTIN 100 MG PO CAPS
100.0000 mg | ORAL_CAPSULE | Freq: Three times a day (TID) | ORAL | Status: DC
  Filled 2013-04-26: qty 42

## 2013-04-26 MED ORDER — TRAZODONE HCL 50 MG PO TABS
50.0000 mg | ORAL_TABLET | Freq: Every evening | ORAL | Status: DC | PRN
  Filled 2013-04-26: qty 14

## 2013-04-26 MED ORDER — INSULIN GLARGINE 100 UNIT/ML ~~LOC~~ SOLN
52.0000 [IU] | Freq: Every day | SUBCUTANEOUS | Status: DC
  Administered 2013-04-26 – 2013-04-27 (×2): 52 [IU] via SUBCUTANEOUS
  Filled 2013-04-26: qty 0.52

## 2013-04-26 MED ORDER — INSULIN GLARGINE 100 UNIT/ML ~~LOC~~ SOLN
50.0000 [IU] | Freq: Every day | SUBCUTANEOUS | Status: DC
  Filled 2013-04-26: qty 0.5

## 2013-04-26 NOTE — Progress Notes (Signed)
Patient ID: Stephen HartshornDeno Guzman, male   DOB: 1965/06/24, 48 y.o.   MRN: [id number] D: Pt is awake and active on the unit this AM. Pt denies SI/HI and A/V hallucinations. Pt rates their depression at 3 and hopelessness at 2. Pt's most recent CIWA score was 0. Pt mood is irritable and his affect is flat. Pt is somewhat demanding and intrusive, but is ultimately cooperative with staff. Pt forwards little but is interacting well with his peers on the unit.   A: Encouraged pt to discuss feelings with staff and administered medication per MD orders. Writer also encouraged pt to participate in groups.  R: Writer will continue to monitor. 15 minute checks are ongoing for safety.

## 2013-04-26 NOTE — Progress Notes (Signed)
[facility] PROGRESS NOTE  Stephen Guzman ZOX:[id number] DOB: [birth year] DOA: 04/20/2013 PCP: No PCP Per Patient  Assessment/Plan: 1. Poorly controlled type 2 diabetes mellitus, with hemoglobin A1c of 10.5 obtained on 04/24/2013. Given her persistent hyperglycemia I increased his Lantus to 45 units subcutaneous each bedtime yesterday along with increasing his metformin from [phone] mg by mouth twice a day. Today his blood sugars were 220 and 233 which reflect an improvement. Will increase his Lantus to 52 units subcutaneous each bedtime. Meanwhile continue daytime coverage with NovoLog 10 units subcutaneous 3 times a day as well as to coverage.   Code Status: Full Code Family Communication: Family not present Disposition Plan: Adjusting insulin regimen    HPI/Subjective: Patient is a 48 year old gentleman with a past medical history of poorly controlled type 2 diabetes mellitus, admitted to the behavioral health center for treatment of depression. During this inpatient stay he has had elevated blood sugars in the 20/50 to 350s range. Initially his oral abscess was increased from 25 units subcutaneous daily 240 units subcutaneous daily with the addition of NovoLog for meal coverage at 8 units 3 times a day. His metformin was continued at 500 mg by mouth twice a day. On 04/25/2013 his blood sugars were reviewed, and he continued to be elevated with blood sugars ranging from 222 to 350.   Objective: Filed Vitals:   04/26/13 0756  BP: 120/85  Pulse: 97  Temp:   Resp:    No intake or output data in the 24 hours ending 04/26/13 1237 Filed Weights   04/20/13 0934  Weight: 111.131 kg (245 lb)    Exam:   General:  No acute distress, awake alert oriented   Cardiovascular: Regular rate and rhythm normal S1-S2  Respiratory: Clear to auscultation bilaterally no wheezing rhonchi or rales  Abdomen: Soft nontender nondistended  Musculoskeletal: No edema  Data Reviewed: Basic Metabolic  Panel:  Recent Labs Lab 04/20/13 0216  NA 135*  K 3.7  CL 98  CO2 23  GLUCOSE 258*  BUN 9  CREATININE 0.76  CALCIUM 8.8   Liver Function Tests:  Recent Labs Lab 04/20/13 0216  AST 41*  ALT 56*  ALKPHOS 88  BILITOT 0.5  PROT 7.8  ALBUMIN 3.4*   No results found for this basename: LIPASE, AMYLASE,  in the last 168 hours No results found for this basename: AMMONIA,  in the last 168 hours CBC:  Recent Labs Lab 04/20/13 0216  WBC 11.6*  NEUTROABS 5.4  HGB 15.7  HCT 43.4  MCV 90.8  PLT 194   Cardiac Enzymes: No results found for this basename: CKTOTAL, CKMB, CKMBINDEX, TROPONINI,  in the last 168 hours BNP (last 3 results) No results found for this basename: PROBNP,  in the last 8760 hours CBG:  Recent Labs Lab 04/25/13 1711 04/25/13 1712 04/25/13 2114 04/26/13 0613 04/26/13 1133  GLUCAP 420* 434* 319* 221* 233*    No results found for this or any previous visit (from the past 240 hour(s)).   Studies: No results found.  Scheduled Meds: . gabapentin  100 mg Oral TID  . insulin aspart  0-15 Units Subcutaneous TID WC  . insulin aspart  10 Units Subcutaneous TID WC  . insulin glargine  50 Units Subcutaneous QHS  . lisinopril  5 mg Oral Daily  . living well with diabetes book   Does not apply Once  . metFORMIN  1,000 mg Oral BID WC  . multivitamin with minerals  1 tablet Oral Daily  .  thiamine  100 mg Oral Daily   Continuous Infusions:   Active Problems:   Alcohol dependence   Cocaine dependence   Substance induced mood disorder   Type II or unspecified type diabetes mellitus with unspecified complication, uncontrolled    Time spent: 35 minutes    Jeralyn BennettZAMORA, Marisol Giambra  [facility] Pager [phone]. If 7PM-7AM, please contact night-coverage at [email or web address], password [email or web address] 04/26/2013, 12:37 PM  LOS: 6 days

## 2013-04-26 NOTE — Psychosocial Assessment (Signed)
Adult Psychoeducational Group Note  Date:  04/26/2013 Time:  1315  Group Topic/Focus:  Recovery Goals:   The focus of this group is to identify appropriate goals for recovery and establish a plan to achieve them. Relapse Prevention Planning:   The focus of this group is to define relapse and discuss the need for planning to combat relapse.  Participation Level:  Active  Participation Quality:  Drowsy, Sharing and Supportive  Affect:  Blunted, Defensive, Irritable and Labile  Cognitive:  Oriented  Insight: Improving  Engagement in Group:  Distracting and Engaged  Modes of Intervention:  Discussion, Education and Exploration  Additional Comments:  Pt is drowsy/sleeping through part of the group and participated during the discussion. Insight is improving somewhat.  Jyles Sontag Shari Prowsvan 04/26/2013, 2:16 PM

## 2013-04-26 NOTE — [facility] Group Notes (Signed)
[facility] Group Notes:  (Clinical Social Work)  04/26/2013     10-11AM  Summary of Progress/Problems:   The main focus of today's process group was for the patient to identify ways in which they have in the past sabotaged their own recovery. Motivational Interviewing and a worksheet were utilized to help patients explore the perceived benefits and costs of their substance use, as well as the potential benefits and costs of stopping.  The Stages of Change were explained and patients were encouraged to make a written plan based on what was shared in group today. The patient expressed that if the use of substances were stopped, he would expect to feel "productive". The benefit of his substance abuse is that it makes him feel normal since he has been doing it since the age of 89, along with the family that is full of users.  He stated that using gives him validation and pleases people, and he is a people pleaser.  The cost is that he is angry and has what he called a "feeling disease".  He stated money is a major trigger for him, and when he is going better and is providing for his family, has money in the bank, has the trust of his family, that will be the biggest potential time for him to relapse.    Type of Therapy:  Group Therapy - Process   Participation Level:  Active  Participation Quality:  Attentive, Sharing and Supportive  Affect:  Blunted  Cognitive:  Oriented  Insight:  Engaged  Engagement in Therapy:  Engaged  Modes of Intervention:  Education, Teacher, English as a foreign languageupport and Processing, Motivational Interviewing  Ambrose MantleMareida Grossman-Orr, LCSW 04/26/2013, 12:34 PM

## 2013-04-26 NOTE — Progress Notes (Signed)
Pt attended the evening AA group in the dayroom. 

## 2013-04-26 NOTE — Progress Notes (Signed)
D:pt stated he has been isolative today. Pt stated he got upset with some of the staff, feeling as though he was being disrespected. Pt felt angry and agitated and the only way to avoid blowing up was to stay away from others. Pt stated " i just bottle things up, and then i explode, and when i explode, i explode. So I've just been in my feelings today, trying not to bother anyone." denies si/hi/avh. Denies pain. Pt CBG: 333 A: 4 units of insulin and 52 units of Lantuss given. q 15 min safety checks. 1:1 time offered R: pt remains safe on unit. No further complaints or signs of distress

## 2013-04-26 NOTE — Progress Notes (Signed)
Patient ID: Stephen HartshornDeno Guzman, male   DOB: 10/19/1965, 48 y.o.   MRN: [id number] [facility]  04/26/2013 11:12 AM Stephen HartshornDeno Guzman  MRN:  [id number] Subjective:  Stephen ArabDeno is doing well.  His depression has improved to a 2/10 and my anxiety increases as the day goes on currently rates anxiety 5/10.  He has been attending groups with participation, I am a big observer, I try to attend all the groups. I like watching people. Once I get myself together I plan on going to school, and being able to give back. I lost my way, and allowing things to take over my life. My sons mother came and took my son back, and I havent been able to get over this yet, that was my tipping point, I had no one to talk too. It all started with drinking, then my drug of choice kicked in"crack cocaine". He reports resting well last night. He also notes cessation in his withdrawal symtpoms, detox treatment is going fairly well. He admits to apprehension, afraid to fall on his face again.  Denies any SI/HI/AVH.   Diagnosis:   DSM5: Schizophrenia Disorders:  Denies Obsessive-Compulsive Disorders:  Denies Trauma-Stressor Disorders:  Denies Substance/Addictive Disorders:  Alcohol Related Disorder - Severe (303.90), Cocaine related disorder Depressive Disorders:  Major Depressive Disorder - Moderate (296.22) Total Time spent with patient: 30 minutes  Axis I: Generalized Anxiety Disorder, Mood Disorder NOS and Substance Induced Mood Disorder  ADL's:  Intact  Sleep: Fair  Appetite:  Fair  Suicidal Ideation:  Plan:  denies Intent:  denies Means:  denies Homicidal Ideation:  Plan:  denies Intent:  denies Means:  denies AEB (as evidenced by):  Psychiatric Specialty Exam: Physical Exam   Review of Systems  Constitutional: Negative.   HENT: Negative.   Eyes: Negative.   Respiratory: Negative.   Cardiovascular: Negative.   Gastrointestinal: Negative.   Genitourinary: Negative.   Musculoskeletal: Negative.   Skin: Negative.    Neurological: Negative.   Endo/Heme/Allergies: Negative.   Psychiatric/Behavioral: Positive for substance abuse. The patient is nervous/anxious.     Blood pressure 120/85, pulse 97, temperature 97.3 F (36.3 C), temperature source Oral, resp. rate 20, height 6' (1.829 m), weight 111.131 kg (245 lb).Body mass index is 33.22 kg/(m^2).  General Appearance: Fairly Groomed  Patent attorneyye Contact::  Fair  Speech:  Clear and Coherent  Volume:  Normal  Mood:  Anxious and Depressed  Affect:  anxious, depressed  Thought Process:  Coherent and Goal Directed  Orientation:  Full (Time, Place, and Person)  Thought Content:  WDL  Suicidal Thoughts:  No  Homicidal Thoughts:  No  Memory:  Immediate;   Fair Recent;   Fair Remote;   Fair  Judgement:  Fair  Insight:  Present  Psychomotor Activity:  Normal  Concentration:  Fair  Recall:  FiservFair  Fund of Knowledge:Fair  Language: Fair  Akathisia:  No  Handed:    AIMS (if indicated):     Assets:  Desire for Improvement Housing  Sleep:  Number of Hours: 6.25   Musculoskeletal: Strength & Muscle Tone: within normal limits Gait & Station: normal Patient leans: N/A  Current Medications: Current Facility-Administered Medications  Medication Dose Route Frequency Provider Last Rate Last Dose  . acetaminophen (TYLENOL) tablet 650 mg  650 mg Oral Q6H PRN Kristeen MansFran E Hobson, NP      . alum & mag hydroxide-simeth (MAALOX/MYLANTA) 200-200-20 MG/5ML suspension 30 mL  30 mL Oral PRN Kristeen MansFran E Hobson, NP      .  gabapentin (NEURONTIN) capsule 100 mg  100 mg Oral TID Rachael Fee, MD   100 mg at 04/26/13 4098  . insulin aspart (novoLOG) injection 0-15 Units  0-15 Units Subcutaneous TID WC Kristeen Mans, NP   5 Units at 04/26/13 [phone]  . insulin aspart (novoLOG) injection 10 Units  10 Units Subcutaneous TID WC Jeralyn Bennett, MD   10 Units at 04/26/13 [phone]  . insulin glargine (LANTUS) injection 50 Units  50 Units Subcutaneous QHS Jeralyn Bennett, MD      . lisinopril  (PRINIVIL,ZESTRIL) tablet 5 mg  5 mg Oral Daily Rachael Fee, MD   5 mg at 04/26/13 9562  . living well with diabetes book MISC   Does not apply Once Rachael Fee, MD      . magnesium hydroxide (MILK OF MAGNESIA) suspension 30 mL  30 mL Oral Daily PRN Kristeen Mans, NP      . metFORMIN (GLUCOPHAGE) tablet 1,000 mg  1,000 mg Oral BID WC Jeralyn Bennett, MD   1,000 mg at 04/26/13 1308  . multivitamin with minerals tablet 1 tablet  1 tablet Oral Daily Rachael Fee, MD   1 tablet at 04/26/13 [phone]  . thiamine (VITAMIN B-1) tablet 100 mg  100 mg Oral Daily Rachael Fee, MD   100 mg at 04/26/13 4696  . traZODone (DESYREL) tablet 50 mg  50 mg Oral QHS PRN Kristeen Mans, NP   50 mg at 04/25/13 2220    Lab Results:  Results for orders placed during the hospital encounter of 04/20/13 (from the past 48 hour(s))  GLUCOSE, CAPILLARY     Status: Abnormal   Collection Time    04/24/13 12:04 PM      Result Value Ref Range   Glucose-Capillary 402 (*) 70 - 99 mg/dL  GLUCOSE, CAPILLARY     Status: Abnormal   Collection Time    04/24/13  2:40 PM      Result Value Ref Range   Glucose-Capillary 434 (*) 70 - 99 mg/dL  GLUCOSE, CAPILLARY     Status: Abnormal   Collection Time    04/24/13  4:55 PM      Result Value Ref Range   Glucose-Capillary 365 (*) 70 - 99 mg/dL   Comment 1 Documented in Chart     Comment 2 Notify RN    HEMOGLOBIN A1C     Status: Abnormal   Collection Time    04/24/13  7:42 PM      Result Value Ref Range   Hemoglobin A1C 10.5 (*) <5.7 %   Comment: (NOTE)                                                                               According to the ADA Clinical Practice Recommendations for 2011, when     HbA1c is used as a screening test:      >=6.5%   Diagnostic of Diabetes Mellitus               (if abnormal result is confirmed)     5.7-6.4%   Increased risk of developing Diabetes Mellitus     References:Diagnosis and Classification of Diabetes Mellitus,Diabetes  WJXB,1478,29(FAOZH 1):S62-S69 and Standards of Medical Care in             Diabetes - 2011,Diabetes Care,2011,34 (Suppl 1):S11-S61.   Mean Plasma Glucose 255 (*) <117 mg/dL   Comment: Performed at [facility]  GLUCOSE, CAPILLARY     Status: Abnormal   Collection Time    04/24/13  9:15 PM      Result Value Ref Range   Glucose-Capillary 364 (*) 70 - 99 mg/dL   Comment 1 Notify RN     Comment 2 Documented in Chart    GLUCOSE, CAPILLARY     Status: Abnormal   Collection Time    04/25/13  6:22 AM      Result Value Ref Range   Glucose-Capillary 221 (*) 70 - 99 mg/dL  GLUCOSE, CAPILLARY     Status: Abnormal   Collection Time    04/25/13 12:01 PM      Result Value Ref Range   Glucose-Capillary 535 (*) 70 - 99 mg/dL   Comment 1 Repeat Test    GLUCOSE, CAPILLARY     Status: Abnormal   Collection Time    04/25/13 12:02 PM      Result Value Ref Range   Glucose-Capillary 360 (*) 70 - 99 mg/dL  GLUCOSE, CAPILLARY     Status: Abnormal   Collection Time    04/25/13  5:11 PM      Result Value Ref Range   Glucose-Capillary 420 (*) 70 - 99 mg/dL  GLUCOSE, CAPILLARY     Status: Abnormal   Collection Time    04/25/13  5:12 PM      Result Value Ref Range   Glucose-Capillary 434 (*) 70 - 99 mg/dL  GLUCOSE, CAPILLARY     Status: Abnormal   Collection Time    04/25/13  9:14 PM      Result Value Ref Range   Glucose-Capillary 319 (*) 70 - 99 mg/dL  GLUCOSE, CAPILLARY     Status: Abnormal   Collection Time    04/26/13  6:13 AM      Result Value Ref Range   Glucose-Capillary 221 (*) 70 - 99 mg/dL    Physical Findings: AIMS: Facial and Oral Movements Muscles of Facial Expression: None, normal Lips and Perioral Area: None, normal Jaw: None, normal Tongue: None, normal,Extremity Movements Upper (arms, wrists, hands, fingers): None, normal Lower (legs, knees, ankles, toes): None, normal, Trunk Movements Neck, shoulders, hips: None, normal, Overall Severity Severity of abnormal  movements (highest score from questions above): None, normal Incapacitation due to abnormal movements: None, normal Patient's awareness of abnormal movements (rate only patient's report): No Awareness, Dental Status Current problems with teeth and/or dentures?: No Does patient usually wear dentures?: No  CIWA:  CIWA-Ar Total: 0 COWS:  COWS Total Score: 2  Treatment Plan Summary: Daily contact with patient to assess and evaluate symptoms and progress in treatment Medication management  Plan:  Review of chart, vital signs, medications, and notes. 1-Individual and group therapy 2-Medication management for depression and anxiety:  Medications reviewed with the patient and he stated no untoward effects, no changes made 3-Coping skills for depression, anxiety 4-Continue crisis stabilization and management 5-Address health issues--monitoring vital signs, stable--Insulin increased from 8 units to 10 units with meals and his lantus was increased to 50 mg tonight, Glucophage 1000 mg daily. Spoke with Dr. Vanessa Barbara, patient will need PCP consultation after discharge. He is currently uninsured, will not be able to afford insulin upon discharge. We can refer to  [facility] for medical services.  6-Treatment plan in progress to prevent relapse of depression and anxiety.  Medical Decision Making Problem Points:  Review of last therapy session (1) and Review of psycho-social stressors (1) Data Points:  Review of medication regiment & side effects (2)  I certify that inpatient services furnished can reasonably be expected to improve the patient's condition.   Truman Hayward, FNP-BC 04/26/2013, 11:12 AM  Reviewed the information documented and agree with the treatment plan.  Malachy Chamber S 04/26/2013 11:12 AM

## 2013-04-26 NOTE — Progress Notes (Addendum)
Pt appears in fair spirits . He was complaining that he wants salads for a snack to stay healthy. Pt was reasurred that this would be available and then he calmed down and ate an apple. Pt has been in the dayroom with the other pts singing hymnals .he has been attending all groups and is [facility]. Pt state his depression is a 3/10 and his hopelessness a 2/10. Pt does contract for safety and denies SI and HI. At 5pm pts. FSBS was 343 neuropathy made aware and pt was covered with 15 units of novolgue. Discussed with pt food choices and healthy eating habits to try to keep his blood sugars under control. Pt did tell the nurse he had hawainn punch for lunch. Pt was instructed that a better choice would be diet coke or just plain water.

## 2013-04-26 NOTE — Progress Notes (Signed)
Pt attended AA group this evening.  

## 2013-04-27 LAB — GLUCOSE, CAPILLARY
GLUCOSE-CAPILLARY: 230 mg/dL — AB (ref 70–99)
Glucose-Capillary: 333 mg/dL — ABNORMAL HIGH (ref 70–99)
Glucose-Capillary: 199 mg/dL — ABNORMAL HIGH (ref 70–99)
Glucose-Capillary: 261 mg/dL — ABNORMAL HIGH (ref 70–99)
Glucose-Capillary: 167 mg/dL — ABNORMAL HIGH (ref 70–99)

## 2013-04-27 MED ORDER — GLIMEPIRIDE 2 MG PO TABS
2.0000 mg | ORAL_TABLET | Freq: Every day | ORAL | Status: DC
  Administered 2013-04-27: 2 mg via ORAL
  Filled 2013-04-27 (×3): qty 1

## 2013-04-27 MED ORDER — INSULIN ASPART 100 UNIT/ML ~~LOC~~ SOLN: 0.0000 [IU] | Freq: Three times a day (TID) | SUBCUTANEOUS | Status: DC

## 2013-04-27 MED ORDER — GLIMEPIRIDE 2 MG PO TABS: 2.0000 mg | ORAL_TABLET | Freq: Every day | ORAL | Status: DC

## 2013-04-27 MED ORDER — INSULIN GLARGINE 100 UNIT/ML ~~LOC~~ SOLN: 52.0000 [IU] | Freq: Every day | SUBCUTANEOUS | Status: DC

## 2013-04-27 MED ORDER — METFORMIN HCL 1000 MG PO TABS: 1000.0000 mg | ORAL_TABLET | Freq: Two times a day (BID) | ORAL | Status: DC

## 2013-04-27 MED ORDER — GABAPENTIN 100 MG PO CAPS: 100.0000 mg | ORAL_CAPSULE | Freq: Three times a day (TID) | ORAL | Status: DC

## 2013-04-27 NOTE — Progress Notes (Signed)
Adult Psychoeducational Group Note  Date:  04/27/2013 Time:  0900  Group Topic/Focus:  Making Healthy Choices:   The focus of this group is to help patients identify negative/unhealthy choices they were using prior to admission and identify positive/healthier coping strategies to replace them upon discharge.  Participation Level:  Active  Participation Quality:  Intrusive, Monopolizing, Resistant and Sharing  Affect:  Angry, Defensive, Irritable and Resistant  Cognitive:  Alert and Oriented  Insight: Lacking  Engagement in Group:  Defensive, Distracting, Engaged, Monopolizing and Resistant  Modes of Intervention:  Confrontation, Discussion, Education and Exploration  Additional Comments:  Pt is resistant to learning new information and monopolizes in order to avoid uncomfortable subject matter. Pt is disruptive and verbally abusive to staff and patients and left group early. Pt has poor insight.  Stephen Guzman Shari Prowsvan 04/27/2013, 12:49 PM

## 2013-04-27 NOTE — Progress Notes (Signed)
Patient ID: Stephen Guzman, male   DOB: Dec 12, 1965, 48 y.o.   MRN: [id number] [facility] MD Progress Note  04/27/2013 11:17 AM Tal Kempker  MRN:  [id number] Subjective:  Stephen Guzman is doing okay, he got into a verbal altercation with his fellow residents states " they were checking his inventory". He got very angry and lost control of his emotions, said Lyman Bishop has come at me 3 times this morning. Pt was able to talk with his wife, and the MHT who were able to calm him down. He is now able to identify what angers him, and is able to control his anger. His depression has improved to a 2/10 and my anxiety increases as the day goes on currently rates anxiety 5/10.  He has been attending groups with participation. He reports notresting well last night due to anxiety about leaving tomorrow. He also notes cessation in his withdrawal symtpoms, detox treatment is going fairly well. He admits to apprehension, afraid to fall on his face again.  Denies any SI/HI/AVH. Upon discharge he plans to go to [facility] for a 90 day program.   Diagnosis:   DSM5: Schizophrenia Disorders:  Denies Obsessive-Compulsive Disorders:  Denies Trauma-Stressor Disorders:  Denies Substance/Addictive Disorders:  Alcohol Related Disorder - Severe (303.90), Cocaine related disorder Depressive Disorders:  Major Depressive Disorder - Moderate (296.22) Total Time spent with patient: 30 minutes  Axis I: Generalized Anxiety Disorder, Mood Disorder NOS and Substance Induced Mood Disorder  ADL's:  Intact  Sleep: Fair  Appetite:  Fair  Suicidal Ideation:  Plan:  denies Intent:  denies Means:  denies Homicidal Ideation:  Plan:  denies Intent:  denies Means:  denies AEB (as evidenced by):  Psychiatric Specialty Exam: Physical Exam   Review of Systems  Constitutional: Negative.   HENT: Negative.   Eyes: Negative.   Respiratory: Negative.   Cardiovascular: Negative.   Gastrointestinal: Negative.   Genitourinary: Negative.   Musculoskeletal:  Negative.   Skin: Negative.   Neurological: Negative.   Endo/Heme/Allergies: Negative.   Psychiatric/Behavioral: Positive for substance abuse. The patient is nervous/anxious.     Blood pressure 124/81, pulse 85, temperature 97.4 F (36.3 C), temperature source Oral, resp. rate 19, height 6' (1.829 m), weight 111.131 kg (245 lb).Body mass index is 33.22 kg/(m^2).  General Appearance: Fairly Groomed  Patent attorney::  Fair  Speech:  Clear and Coherent  Volume:  Normal  Mood:  Anxious and Depressed  Affect:  anxious, depressed  Thought Process:  Coherent and Goal Directed  Orientation:  Full (Time, Place, and Person)  Thought Content:  WDL  Suicidal Thoughts:  No  Homicidal Thoughts:  No  Memory:  Immediate;   Fair Recent;   Fair Remote;   Fair  Judgement:  Fair  Insight:  Present  Psychomotor Activity:  Normal  Concentration:  Fair  Recall:  [employer] of Knowledge:Fair  Language: Fair  Akathisia:  No  Handed:    AIMS (if indicated):     Assets:  Desire for Improvement Housing  Sleep:  Number of Hours: 6.75   Musculoskeletal: Strength & Muscle Tone: within normal limits Gait & Station: normal Patient leans: N/A  Current Medications: Current Facility-Administered Medications  Medication Dose Route Frequency Provider Last Rate Last Dose  . acetaminophen (TYLENOL) tablet 650 mg  650 mg Oral Q6H PRN Kristeen Mans, NP      . alum & mag hydroxide-simeth (MAALOX/MYLANTA) 200-200-20 MG/5ML suspension 30 mL  30 mL Oral PRN Kristeen Mans, NP      .  gabapentin (NEURONTIN) capsule 100 mg  100 mg Oral TID Rachael Fee, MD   100 mg at 04/27/13 0750  . glimepiride (AMARYL) tablet 2 mg  2 mg Oral Q breakfast Jeralyn Bennett, MD   2 mg at 04/27/13 0715  . insulin aspart (novoLOG) injection 0-20 Units  0-20 Units Subcutaneous TID WC Jeralyn Bennett, MD   7 Units at 04/27/13 [phone]  . insulin aspart (novoLOG) injection 0-5 Units  0-5 Units Subcutaneous QHS Jeralyn Bennett, MD   4 Units at  04/26/13 2201  . insulin aspart (novoLOG) injection 10 Units  10 Units Subcutaneous TID WC Jeralyn Bennett, MD   10 Units at 04/27/13 [phone]  . insulin glargine (LANTUS) injection 52 Units  52 Units Subcutaneous QHS Jeralyn Bennett, MD   52 Units at 04/26/13 2200  . lisinopril (PRINIVIL,ZESTRIL) tablet 5 mg  5 mg Oral Daily Rachael Fee, MD   5 mg at 04/27/13 0750  . living well with diabetes book MISC   Does not apply Once Rachael Fee, MD      . magnesium hydroxide (MILK OF MAGNESIA) suspension 30 mL  30 mL Oral Daily PRN Kristeen Mans, NP      . metFORMIN (GLUCOPHAGE) tablet 1,000 mg  1,000 mg Oral BID WC Jeralyn Bennett, MD   1,000 mg at 04/27/13 0749  . multivitamin with minerals tablet 1 tablet  1 tablet Oral Daily Rachael Fee, MD   1 tablet at 04/27/13 [phone]  . thiamine (VITAMIN B-1) tablet 100 mg  100 mg Oral Daily Rachael Fee, MD   100 mg at 04/27/13 0749  . traZODone (DESYREL) tablet 50 mg  50 mg Oral QHS PRN Kristeen Mans, NP   50 mg at 04/26/13 2200    Lab Results:  Results for orders placed during the Guzman encounter of 04/20/13 (from the past 48 hour(s))  GLUCOSE, CAPILLARY     Status: Abnormal   Collection Time    04/25/13 12:01 PM      Result Value Ref Range   Glucose-Capillary 535 (*) 70 - 99 mg/dL   Comment 1 Repeat Test    GLUCOSE, CAPILLARY     Status: Abnormal   Collection Time    04/25/13 12:02 PM      Result Value Ref Range   Glucose-Capillary 360 (*) 70 - 99 mg/dL  GLUCOSE, CAPILLARY     Status: Abnormal   Collection Time    04/25/13  5:11 PM      Result Value Ref Range   Glucose-Capillary 420 (*) 70 - 99 mg/dL  GLUCOSE, CAPILLARY     Status: Abnormal   Collection Time    04/25/13  5:12 PM      Result Value Ref Range   Glucose-Capillary 434 (*) 70 - 99 mg/dL  GLUCOSE, CAPILLARY     Status: Abnormal   Collection Time    04/25/13  9:14 PM      Result Value Ref Range   Glucose-Capillary 319 (*) 70 - 99 mg/dL  GLUCOSE, CAPILLARY     Status: Abnormal    Collection Time    04/26/13  6:13 AM      Result Value Ref Range   Glucose-Capillary 221 (*) 70 - 99 mg/dL  GLUCOSE, CAPILLARY     Status: Abnormal   Collection Time    04/26/13 11:33 AM      Result Value Ref Range   Glucose-Capillary 233 (*) 70 - 99 mg/dL  GLUCOSE, CAPILLARY  Status: Abnormal   Collection Time    04/26/13  4:44 PM      Result Value Ref Range   Glucose-Capillary 344 (*) 70 - 99 mg/dL  GLUCOSE, CAPILLARY     Status: Abnormal   Collection Time    04/26/13  9:31 PM      Result Value Ref Range   Glucose-Capillary 333 (*) 70 - 99 mg/dL   Comment 1 Notify RN    GLUCOSE, CAPILLARY     Status: Abnormal   Collection Time    04/27/13  5:58 AM      Result Value Ref Range   Glucose-Capillary 230 (*) 70 - 99 mg/dL   Comment 1 Notify RN     Comment 2 Documented in Chart      Physical Findings: AIMS: Facial and Oral Movements Muscles of Facial Expression: None, normal Lips and Perioral Area: None, normal Jaw: None, normal Tongue: None, normal,Extremity Movements Upper (arms, wrists, hands, fingers): None, normal Lower (legs, knees, ankles, toes): None, normal, Trunk Movements Neck, shoulders, hips: None, normal, Overall Severity Severity of abnormal movements (highest score from questions above): None, normal Incapacitation due to abnormal movements: None, normal Patient's awareness of abnormal movements (rate only patient's report): No Awareness, Dental Status Current problems with teeth and/or dentures?: No Does patient usually wear dentures?: No  CIWA:  CIWA-Ar Total: 0 COWS:  COWS Total Score: 2  Treatment Plan Summary: Daily contact with patient to assess and evaluate symptoms and progress in treatment Medication management  Plan:  Review of chart, vital signs, medications, and notes. 1-Individual and group therapy 2-Medication management for depression and anxiety:  Medications reviewed with the patient and he stated no untoward effects, no changes  made 3-Coping skills for depression, anxiety 4-Continue crisis stabilization and management 5-Address health issues--monitoring vital signs, stable--Insulin increased from 8 units to 10 units with meals and his lantus was increased to 50 mg tonight, Glucophage 1000 mg daily. Spoke with Dr. Vanessa BarbaraZamora, patient will need PCP consultation after discharge. He is currently uninsured, will not be able to afford insulin upon discharge. [facility].  6-Treatment plan in progress to prevent relapse of depression and anxiety.  Medical Decision Making Problem Points:  Review of last therapy session (1) and Review of psycho-social stressors (1) Data Points:  Review of medication regiment & side effects (2)  I certify that inpatient services furnished can reasonably be expected to improve the patient's condition.   Truman HaywardSTARKES, Stefanny Pieri S, FNP-BC 04/27/2013, 11:17 AM  Reviewed the information documented and agree with the treatment plan.  Malachy ChamberSTARKES, Chailyn Racette S 04/27/2013 11:17 AM

## 2013-04-27 NOTE — [facility] Group Notes (Addendum)
[facility] Group Notes:  (Clinical Social Work)  04/27/2013  10:00-11:00AM  Summary of Progress/Problems:   The main focus of today's process group was to   identify the patient's current support system and decide on other supports that can be put in place.  The picture on workbook was used to discuss why additional supports are needed.  An emphasis was placed on using counselor, doctor, therapy groups, 12-step groups, and problem-specific support groups to expand supports.   There was also an extensive discussion about what constitutes a healthy support versus an unhealthy support.  The patient expressed full comprehension of the concepts presented, and agreed that there is a need to add more supports.  The patient stated the current supports in place are his wife and his upcoming stay [facility].  The supports that are to be added are himself, AA and a sponsor, as well as marital counselor.  He then talked about how "liking loose women" feeds into his alcohol addiction and his wife knows about the alcohol but not the sex.  He does not want to tell her, and does not see being honest about that as a need.  He was sufficiently insightful to talk about his outburst this morning with another group member being the result of his addict's mind.  Type of Therapy:  Process Group with Motivational Interviewing  Participation Level:  Active  Participation Quality:  Attentive and Sharing  Affect:  Blunted  Cognitive:  Oriented  Insight:  Developing/Improving  Engagement in Therapy:  Engaged  Modes of Intervention:   Education, Support and Processing, Activity  Pilgrim's PrideMareida Grossman-Orr, LCSW 04/27/2013, 12:15pm

## 2013-04-27 NOTE — Progress Notes (Signed)
[facility] PROGRESS NOTE  Stephen Guzman ZOX:[id number] DOB: [birth year] DOA: 04/20/2013 PCP: No PCP Per Patient  Assessment/Plan: 1. Poorly controlled type 2 diabetes mellitus, with hemoglobin A1c of 10.5 obtained on 04/24/2013. Blood sugars appear improved however remain suboptimal. I suspect his dietary discrepancies are leading to elevated sugars. He relates having second servings at the cafeteria during meals and eating more than what he does at home. He had not been receiving sugar-free snacks either. I spoke at length with him regarding diabetic management and decreasing carb intake. Will continue the current regimen for now, however his diabetic regimen may need to be decreased on discharge.   Code Status: Full Code Family Communication: Family not present Disposition Plan: Adjusting insulin regimen    HPI/Subjective: Patient is a 48 year old gentleman with a past medical history of poorly controlled type 2 diabetes mellitus, admitted to the behavioral health center for treatment of depression. During this inpatient stay he has had elevated blood sugars in the 20/50 to 350s range. Initially his oral abscess was increased from 25 units subcutaneous daily 240 units subcutaneous daily with the addition of NovoLog for meal coverage at 8 units 3 times a day. His metformin was continued at 500 mg by mouth twice a day. On 04/25/2013 his blood sugars were reviewed, and he continued to be elevated with blood sugars ranging from 222 to 350.  On 04/27/13 patient's blood sugars improved, however remain elevated. He admits to dietary indiscretions, having second servings in the cafeteria, drinking fruit juices and possibly high carb snacks.    Objective: Filed Vitals:   04/27/13 0749  BP: 124/81  Pulse:   Temp:   Resp:    No intake or output data in the 24 hours ending 04/27/13 1558 Filed Weights   04/20/13 0934  Weight: 111.131 kg (245 lb)    Exam:   General:  No acute distress, awake  alert oriented   Cardiovascular: Regular rate and rhythm normal S1-S2  Respiratory: Clear to auscultation bilaterally no wheezing rhonchi or rales  Abdomen: Soft nontender nondistended  Musculoskeletal: No edema  Data Reviewed: Basic Metabolic Panel: No results found for this basename: NA, K, CL, CO2, GLUCOSE, BUN, CREATININE, CALCIUM, MG, PHOS,  in the last 168 hours Liver Function Tests: No results found for this basename: AST, ALT, ALKPHOS, BILITOT, PROT, ALBUMIN,  in the last 168 hours No results found for this basename: LIPASE, AMYLASE,  in the last 168 hours No results found for this basename: AMMONIA,  in the last 168 hours CBC: No results found for this basename: WBC, NEUTROABS, HGB, HCT, MCV, PLT,  in the last 168 hours Cardiac Enzymes: No results found for this basename: CKTOTAL, CKMB, CKMBINDEX, TROPONINI,  in the last 168 hours BNP (last 3 results) No results found for this basename: PROBNP,  in the last 8760 hours CBG:  Recent Labs Lab 04/26/13 1133 04/26/13 1644 04/26/13 2131 04/27/13 0558 04/27/13 1144  GLUCAP 233* 344* 333* 230* 199*    No results found for this or any previous visit (from the past 240 hour(s)).   Studies: No results found.  Scheduled Meds: . gabapentin  100 mg Oral TID  . glimepiride  2 mg Oral Q breakfast  . insulin aspart  0-20 Units Subcutaneous TID WC  . insulin aspart  0-5 Units Subcutaneous QHS  . insulin aspart  10 Units Subcutaneous TID WC  . insulin glargine  52 Units Subcutaneous QHS  . lisinopril  5 mg Oral Daily  . living well  with diabetes book   Does not apply Once  . metFORMIN  1,000 mg Oral BID WC  . multivitamin with minerals  1 tablet Oral Daily  . thiamine  100 mg Oral Daily   Continuous Infusions:   Active Problems:   Alcohol dependence   Cocaine dependence   Substance induced mood disorder   Type II or unspecified type diabetes mellitus with unspecified complication, uncontrolled    Time spent: 35  minutes    Jeralyn BennettZAMORA, Nalla Purdy  [facility] Pager [phone]. If 7PM-7AM, please contact night-coverage at [email or web address], password [facility] 04/27/2013, 3:58 PM  LOS: 7 days

## 2013-04-27 NOTE — Progress Notes (Addendum)
Patient ID: Stephen Guzman, male   DOB: 1965-10-17, 48 y.o.   MRN: [id number] D: Pt is awake and active on the unit this AM. Pt denies SI/HI and A/V hallucinations. Pt rates their depression at 2 and hopelessness at 5. Pt mood is irritable and his affect is angry. Pt writes that he plans to "take meds and attend AA meetings." Pt is intrusive and monopolizing during group this AM. Writer and patients were discussing AA as a healthy support system and recovery concepts. Pt is resistant to staff input and became verbally aggressive when writer challenged his belief system regarding addiction and recovery. Pt was verbally abusive to fellow patients as well, and was demanding to [facility]. However, pt was calm and cooperative during the ensuing group, and has stopped disrupting the milieu. Pt no longer wishes to leave early and will d/c in the AM as previously determined.  A: Encouraged pt to discuss feelings with staff and administered medication per MD orders. Writer also encouraged pt to participate in groups.  R: Writer will continue to monitor. 15 minute checks are ongoing for safety.

## 2013-04-27 NOTE — Progress Notes (Signed)
D: pt is cooperative. Pt has been agitated throughout the day. Pt is ready to leave. denies si/hi/avh. Denies pain. Pt cbg 167 this evening. No coverage needed. Writer went over AVS with pt. Pt expressed understanding.  A: 1:1 time given. scheduled medications given R: pt remains safe on unit. No signs of distress or complaints at this time.

## 2013-04-27 NOTE — [facility] Suicide Risk Assessment (Signed)
Suicide Risk Assessment  Discharge Assessment     Demographic Factors:  Male  Total Time spent with patient: 30 minutes     Mental Status Per Nursing Assessment::   On Admission:   pt deneis  Current Mental Status by Physician: No si, no hi, logical  Loss Factors: Decrease in vocational status  Historical Factors: Impulsivity  Risk Reduction Factors:   Living with another person, especially a relative  Continued Clinical Symptoms:  Alcohol/Substance Abuse/Dependencies  Cognitive Features That Contribute To Risk:  Closed-mindedness    Suicide Risk:  Minimal: No identifiable suicidal ideation.  Patients presenting with no risk factors but with morbid ruminations; may be classified as minimal risk based on the severity of the depressive symptoms  Discharge Diagnoses:   AXIS I:  Substance Abuse AXIS II:  Deferred AXIS III:   Past Medical History  Diagnosis Date  . Diabetes mellitus without complication   . Hypercholesteremia   . Manic depression   . Hypertension    AXIS IV:  other psychosocial or environmental problems AXIS V:  41-50 serious symptoms  Plan Of Care/Follow-up recommendations:  [facility]:  [facility]  Is patient on multiple antipsychotic therapies at discharge:  No   Has Patient had three or more failed trials of antipsychotic monotherapy by history:  No  Recommended Plan for Multiple Antipsychotic Therapies: NA    Witt Plitt 04/27/2013, 3:08 PM

## 2013-04-27 NOTE — Progress Notes (Signed)
Adult Psychoeducational Group Note  Date:  04/27/2013 Time:  6:28 PM  Group Topic/Focus:  Coping With Mental Health Crisis:   The purpose of this group is to help patients identify strategies for coping with mental health crisis.  Group discusses possible causes of crisis and ways to manage them effectively.  Participation Level:  Active  Participation Quality:  Appropriate  Affect:  Appropriate  Cognitive:  Appropriate  Insight: Appropriate  Engagement in Group:  Engaged  Modes of Intervention:  Discussion  Additional Comments:  Pt was present in group and played coping skills pictionary. Stephen Guzman actively participated in group. He said that shopping is his favorite coping skill. He likes to shop at [name]   Edrick Whitehorn A 04/27/2013, 6:28 PM

## 2013-04-27 NOTE — Progress Notes (Signed)
Patient did attend the evening speaker AA meeting.  

## 2013-04-28 LAB — GLUCOSE, CAPILLARY: GLUCOSE-CAPILLARY: 120 mg/dL — AB (ref 70–99)

## 2013-04-28 MED ORDER — METFORMIN HCL 500 MG PO TABS: 1000.0000 mg | ORAL_TABLET | Freq: Two times a day (BID) | ORAL | Status: DC

## 2013-04-28 MED ORDER — METFORMIN HCL 500 MG PO TABS: 500.0000 mg | ORAL_TABLET | Freq: Two times a day (BID) | ORAL | Status: DC

## 2013-04-28 MED ORDER — GABAPENTIN 100 MG PO CAPS: 100.0000 mg | ORAL_CAPSULE | Freq: Three times a day (TID) | ORAL | Status: DC

## 2013-04-28 MED ORDER — METFORMIN HCL 500 MG PO TABS
500.0000 mg | ORAL_TABLET | Freq: Two times a day (BID) | ORAL | Status: DC
  Filled 2013-04-28 (×2): qty 28
  Filled 2013-04-28: qty 1
  Filled 2013-04-28 (×2): qty 28

## 2013-04-28 MED ORDER — INSULIN ASPART 100 UNIT/ML ~~LOC~~ SOLN: SUBCUTANEOUS | Status: DC

## 2013-04-28 MED ORDER — INSULIN ASPART 100 UNIT/ML ~~LOC~~ SOLN
0.0000 [IU] | SUBCUTANEOUS | Status: DC
  Filled 2013-04-28: qty 0.2

## 2013-04-28 MED ORDER — GLIMEPIRIDE 2 MG PO TABS: 2.0000 mg | ORAL_TABLET | Freq: Every day | ORAL | Status: DC

## 2013-04-28 NOTE — Progress Notes (Signed)
Nurs Dischg Note:  D:Patient denies SI/HI at this time. Pt appears calm and cooperative, and no distress noted.  A: All Personal items in locker returned to pt. Pt escorted out of the building to a taxi cab [facility]. Pt given a plate of food and a drink.   R:  Pt States she will comply with outpatient services, and take MEDS as prescribed.

## 2013-04-30 NOTE — Progress Notes (Signed)
Patient Discharge Instructions:  After Visit Summary (AVS):   Faxed to:  04/30/13 Discharge Summary Note:   Faxed to:  04/30/13 Psychiatric Admission Assessment Note:   Faxed to:  04/30/13 Suicide Risk Assessment - Discharge Assessment:   Faxed to:  04/30/13 Faxed/Sent to the Next Level Care provider:  04/30/13 [facility]axed to [facility] @ [phone] Faxed to [facility] @ [phone]  Jerelene ReddenSheena E [address], 04/30/2013, 1:05 PM

## 2013-05-01 NOTE — Clinical Social Work Note (Signed)
Brannan called [facility] to discuss medication problem (diabetes medication). Pt stated that [facility] could not give him one of his meds unless they had order for sliding scale. CSW contacted Aggie and Dr. Dub MikesLugo. Aggie stated that she had already spoken with pt and told him and the facility yesterday and that he must get order from PCP. Pt told CSW that he does not have PCP and had not had prescription in 2 years prior to [facility]. Pt became increasingly upset and agitated on the phone. CSW spoke with Casimiro NeedleMichael at [facility], the pt's social worker to explain the issue. According to Dr. Dub MikesLugo, [facility] own [facility] and cannot provide this for pt when they leave hospital. Pt must see PCP to get this order. CSW contacted Stanton KidneyDebra to explain situation and told Casimiro NeedleMichael and pt that someone would contact them today.   The Sherwin-WilliamsHeather Smart, LCSWA  05/01/2013 4:26 PM

## 2013-10-29 ENCOUNTER — Encounter (HOSPITAL_COMMUNITY): Payer: Self-pay | Admitting: Emergency Medicine

## 2013-10-29 ENCOUNTER — Emergency Department (HOSPITAL_COMMUNITY)
Admission: EM | Admit: 2013-10-29 | Discharge: 2013-10-29 | Disposition: A | Discharge status: Home / Self Care | Payer: Medicaid Other | Attending: Emergency Medicine | Admitting: Emergency Medicine

## 2013-10-29 ENCOUNTER — Ambulatory Visit (HOSPITAL_COMMUNITY)
Admission: RE | Admit: 2013-10-29 | Discharge: 2013-10-29 | Disposition: A | Discharge status: Home / Self Care | Payer: Medicaid Other | Attending: Psychiatry | Admitting: Psychiatry

## 2013-10-29 DIAGNOSIS — F319 Bipolar disorder, unspecified: Secondary | ICD-10-CM | POA: Insufficient documentation

## 2013-10-29 DIAGNOSIS — R1011 Right upper quadrant pain: Secondary | ICD-10-CM | POA: Insufficient documentation

## 2013-10-29 DIAGNOSIS — F1020 Alcohol dependence, uncomplicated: Secondary | ICD-10-CM

## 2013-10-29 DIAGNOSIS — Z794 Long term (current) use of insulin: Secondary | ICD-10-CM | POA: Insufficient documentation

## 2013-10-29 DIAGNOSIS — Z79899 Other long term (current) drug therapy: Secondary | ICD-10-CM | POA: Insufficient documentation

## 2013-10-29 DIAGNOSIS — E119 Type 2 diabetes mellitus without complications: Secondary | ICD-10-CM | POA: Insufficient documentation

## 2013-10-29 DIAGNOSIS — F17200 Nicotine dependence, unspecified, uncomplicated: Secondary | ICD-10-CM | POA: Insufficient documentation

## 2013-10-29 DIAGNOSIS — IMO0002 Reserved for concepts with insufficient information to code with codable children: Secondary | ICD-10-CM

## 2013-10-29 DIAGNOSIS — E118 Type 2 diabetes mellitus with unspecified complications: Secondary | ICD-10-CM

## 2013-10-29 DIAGNOSIS — I10 Essential (primary) hypertension: Secondary | ICD-10-CM | POA: Insufficient documentation

## 2013-10-29 DIAGNOSIS — E1165 Type 2 diabetes mellitus with hyperglycemia: Secondary | ICD-10-CM

## 2013-10-29 DIAGNOSIS — F1420 Cocaine dependence, uncomplicated: Secondary | ICD-10-CM

## 2013-10-29 LAB — COMPREHENSIVE METABOLIC PANEL
ALT: 73 U/L — ABNORMAL HIGH (ref 0–53)
AST: 56 U/L — ABNORMAL HIGH (ref 0–37)
Albumin: 3.8 g/dL (ref 3.5–5.2)
Alkaline Phosphatase: 115 U/L (ref 39–117)
Anion gap: 14 (ref 5–15)
BUN: 13 mg/dL (ref 6–23)
CO2: 20 meq/L (ref 19–32)
CREATININE: 0.85 mg/dL (ref 0.50–1.35)
Calcium: 9.3 mg/dL (ref 8.4–10.5)
Chloride: 96 mEq/L (ref 96–112)
GLUCOSE: 384 mg/dL — AB (ref 70–99)
Potassium: 5.1 mEq/L (ref 3.7–5.3)
SODIUM: 130 meq/L — AB (ref 137–147)
Total Bilirubin: 0.6 mg/dL (ref 0.3–1.2)
Total Protein: 8.7 g/dL — ABNORMAL HIGH (ref 6.0–8.3)

## 2013-10-29 LAB — URINALYSIS, ROUTINE W REFLEX MICROSCOPIC
Bilirubin Urine: NEGATIVE
Glucose, UA: >1000 mg/dL — AB
HGB URINE DIPSTICK: NEGATIVE
KETONES UR: NEGATIVE mg/dL
Leukocytes, UA: NEGATIVE
NITRITE: NEGATIVE
Protein, ur: NEGATIVE mg/dL
Specific Gravity, Urine: 1.035 — ABNORMAL HIGH (ref 1.005–1.030)
Urobilinogen, UA: 1 mg/dL (ref 0.0–1.0)
pH: 5.5 (ref 5.0–8.0)

## 2013-10-29 LAB — ETHANOL: Alcohol, Ethyl (B): <11 mg/dL (ref 0–11)

## 2013-10-29 LAB — RAPID URINE DRUG SCREEN, HOSP PERFORMED
Amphetamines: NOT DETECTED
BARBITURATES: NOT DETECTED
Benzodiazepines: NOT DETECTED
Cocaine: POSITIVE — AB
Opiates: NOT DETECTED
Tetrahydrocannabinol: NOT DETECTED

## 2013-10-29 LAB — CBC WITH DIFFERENTIAL/PLATELET
Basophils Absolute: 0 10*3/uL (ref 0.0–0.1)
Basophils Relative: 0 % (ref 0–1)
EOS PCT: 1 % (ref 0–5)
Eosinophils Absolute: 0.1 10*3/uL (ref 0.0–0.7)
HEMATOCRIT: 45.7 % (ref 39.0–52.0)
Hemoglobin: 16.2 g/dL (ref 13.0–17.0)
LYMPHS PCT: 46 % (ref 12–46)
Lymphs Abs: 4.1 10*3/uL — ABNORMAL HIGH (ref 0.7–4.0)
MCH: 31.6 pg (ref 26.0–34.0)
MCHC: 35.4 g/dL (ref 30.0–36.0)
MCV: 89.1 fL (ref 78.0–100.0)
MONO ABS: 0.8 10*3/uL (ref 0.1–1.0)
MONOS PCT: 9 % (ref 3–12)
Neutro Abs: 3.8 10*3/uL (ref 1.7–7.7)
Neutrophils Relative %: 44 % (ref 43–77)
PLATELETS: 213 10*3/uL (ref 150–400)
RBC: 5.13 MIL/uL (ref 4.22–5.81)
RDW: 13.1 % (ref 11.5–15.5)
WBC: 8.8 10*3/uL (ref 4.0–10.5)

## 2013-10-29 LAB — URINE MICROSCOPIC-ADD ON: URINE-OTHER: NONE SEEN

## 2013-10-29 LAB — LIPASE, BLOOD: Lipase: 43 U/L (ref 11–59)

## 2013-10-29 LAB — CBG MONITORING, ED: Glucose-Capillary: 360 mg/dL — ABNORMAL HIGH (ref 70–99)

## 2013-10-29 MED ORDER — ACETAMINOPHEN 325 MG PO TABS
650.0000 mg | ORAL_TABLET | ORAL | Status: DC | PRN
Start: 2013-10-29 — End: 2013-10-29

## 2013-10-29 MED ORDER — ZOLPIDEM TARTRATE 5 MG PO TABS: 5.0000 mg | ORAL_TABLET | Freq: Every evening | ORAL | Status: DC | PRN

## 2013-10-29 MED ORDER — IBUPROFEN 200 MG PO TABS: 600.0000 mg | ORAL_TABLET | Freq: Three times a day (TID) | ORAL | Status: DC | PRN

## 2013-10-29 MED ORDER — INSULIN GLARGINE 100 UNIT/ML ~~LOC~~ SOLN: 52.0000 [IU] | Freq: Every day | SUBCUTANEOUS | Status: DC

## 2013-10-29 MED ORDER — ALUM & MAG HYDROXIDE-SIMETH 200-200-20 MG/5ML PO SUSP: 30.0000 mL | ORAL | Status: DC | PRN

## 2013-10-29 MED ORDER — METFORMIN HCL 500 MG PO TABS
1000.0000 mg | ORAL_TABLET | Freq: Two times a day (BID) | ORAL | Status: DC
  Filled 2013-10-29 (×2): qty 2

## 2013-10-29 MED ORDER — GLIMEPIRIDE 2 MG PO TABS
2.0000 mg | ORAL_TABLET | Freq: Every day | ORAL | Status: DC
  Filled 2013-10-29: qty 1

## 2013-10-29 MED ORDER — LISINOPRIL 5 MG PO TABS
5.0000 mg | ORAL_TABLET | Freq: Every day | ORAL | Status: DC
  Filled 2013-10-29: qty 1

## 2013-10-29 MED ORDER — ONDANSETRON HCL 4 MG PO TABS: 4.0000 mg | ORAL_TABLET | Freq: Three times a day (TID) | ORAL | Status: DC | PRN

## 2013-10-29 MED ORDER — NICOTINE 21 MG/24HR TD PT24: 21.0000 mg | MEDICATED_PATCH | Freq: Every day | TRANSDERMAL | Status: DC

## 2013-10-29 MED ORDER — LORAZEPAM 1 MG PO TABS: 1.0000 mg | ORAL_TABLET | Freq: Three times a day (TID) | ORAL | Status: DC | PRN

## 2013-10-29 NOTE — ED Provider Notes (Signed)
CSN: [id number]     Arrival date & time 10/29/13  1045 History  This chart was scribed for non-physician practitioner, Allen Derry, PA-C working with Purvis Sheffield, MD by Greggory Stallion, ED scribe. This patient was seen in room [facility]TR4/[facility]PT4 and the patient's care was started at 12:01 PM.     Chief Complaint  Patient presents with  . Etoh/cocaine detox    The history is provided by the patient. No language interpreter was used.   HPI Comments: Stephen Guzman is a 48 y.o. male with history of diabetes, hypertension, hypercholesteremia and manic depression who presents to the Emergency Department requesting alcohol and cocaine detox. States he is a binge drinker and drinks "a lot". Pt drinks beer, wine and liquor. Reports drinking 5-6 times per month; the last time being yesterday. Pt last smoked crack cocaine last night and had about 4-5 grams. States he only uses the cocaine when he starts binge drinking. He states that he uses alcohol and drugs to kill himself but denies SI currently. Denies HI. Pt has gone thru detox in the past. Denies history of seizures or tremors with detoxing. Pt used to use novalog, lantus and metformin for his diabetes but has not used it in 1.5 months. Reports cramping myalgias, sweats, difficulty urinating and dysuria today. Denies ear pain, nasal congestion, sinus pressure, rhinorrhea, chest pain, SOB, abdominal pain, nausea, emesis, constipation, audio or visual hallucinations. Pt smokes 0.5-1 packs of cigarettes per day.   Past Medical History  Diagnosis Date  . Diabetes mellitus without complication   . Hypercholesteremia   . Manic depression   . Hypertension    Past Surgical History  Procedure Laterality Date  . Total hip arthroplasty     No family history on file. History  Substance Use Topics  . Smoking status: Current Every Day Smoker -- 0.50 packs/day    Types: Cigarettes  . Smokeless tobacco: Not on file  . Alcohol Use: 14.4 oz/week    24 Cans  of beer per week    Review of Systems  HENT: Negative for congestion, ear pain, rhinorrhea and sinus pressure.   Respiratory: Negative for shortness of breath.   Cardiovascular: Negative for chest pain.  Gastrointestinal: Negative for nausea, vomiting, abdominal pain and constipation.  Genitourinary: Positive for dysuria and difficulty urinating.  Musculoskeletal: Positive for myalgias.  Neurological: Negative for tremors and seizures.  Psychiatric/Behavioral: Negative for suicidal ideas and hallucinations.  10 Systems reviewed and are negative for acute change except as noted in the HPI.  Allergies  Review of patient's allergies indicates no known allergies.  Home Medications   Prior to Admission medications   Medication Sig Start Date End Date Taking? Authorizing Provider  gabapentin (NEURONTIN) 100 MG capsule Take 1 capsule (100 mg total) by mouth 3 (three) times daily. For substance withdrawal syndrome 04/28/13  Yes Sanjuana Kava, NP  glimepiride (AMARYL) 2 MG tablet Take 1 tablet (2 mg total) by mouth daily with breakfast. For diabetes management 04/28/13  Yes Sanjuana Kava, NP  insulin aspart (NOVOLOG) 100 UNIT/ML injection Sliding scale as directed by medical physician. Per Patient uses sliding scale at home 04/28/13  Yes Rachael Fee, MD  insulin glargine (LANTUS) 100 UNIT/ML injection Inject 0.52 mLs (52 Units total) into the skin at bedtime. 04/27/13  Yes Truman Hayward, FNP  lisinopril (PRINIVIL,ZESTRIL) 5 MG tablet Take 1 tablet (5 mg total) by mouth daily. For hypertension 04/23/13  Yes Sanjuana Kava, NP  metFORMIN (GLUCOPHAGE) 500 MG  tablet Take 2 tablets (1,000 mg total) by mouth 2 (two) times daily with a meal. For diabetes 04/28/13  Yes Rachael Fee, MD  traZODone (DESYREL) 50 MG tablet Take 1 tablet (50 mg total) by mouth at bedtime as needed for sleep. 04/23/13  Yes Sanjuana Kava, NP   BP 140/95  Pulse 81  Temp(Src) 98.5 F (36.9 C) (Oral)  Resp 20  SpO2 96%  Physical  Exam  Nursing note and vitals reviewed. Constitutional: He is oriented to person, place, and time. Vital signs are normal. He appears well-developed and well-nourished. No distress.  VSS, alert and cooperative, NAD  HENT:  Head: Normocephalic and atraumatic.  Right Ear: Tympanic membrane and ear canal normal.  Left Ear: Tympanic membrane and ear canal normal.  Nose: Nose normal. No rhinorrhea.  Mouth/Throat: Oropharynx is clear and moist and mucous membranes are normal.  No rhinorrhea  Eyes: Conjunctivae and EOM are normal. Pupils are equal, round, and reactive to light.  Neck: Normal range of motion. Neck supple.  Cardiovascular: Normal rate, regular rhythm, normal heart sounds and intact distal pulses.  Exam reveals no gallop and no friction rub.   No murmur heard. Pulmonary/Chest: Effort normal and breath sounds normal. No respiratory distress. He has no decreased breath sounds. He has no wheezes. He has no rhonchi. He has no rales.  Abdominal: Soft. Normal appearance and bowel sounds are normal. He exhibits no distension. There is tenderness (mild) in the right upper quadrant. There is no rigidity, no rebound, no guarding, no CVA tenderness, no tenderness at McBurney's point and negative Murphy's sign.  Mildly tender in RUQ without murphys's sign. No r/g/r. Soft, nondistended, no ascites, no CVA TTP, no mcburney's point TTP  Musculoskeletal: Normal range of motion.  MAE x4  Neurological: He is alert and oriented to person, place, and time. He has normal strength. No sensory deficit. Coordination and gait normal.  nonataxic gait, sensation and strength grossly intact, coordination WNL  Skin: Skin is warm, dry and intact. No rash noted.  Psychiatric: He has a normal mood and affect. His speech is normal and behavior is normal. He is not actively hallucinating. Thought content is not paranoid and not delusional. Cognition and memory are normal. He expresses no homicidal and no suicidal  ideation. He expresses no suicidal plans and no homicidal plans.    ED Course  Procedures (including critical care time)  DIAGNOSTIC STUDIES: Oxygen Saturation is 96% on RA, normal by my interpretation.    COORDINATION OF CARE: 12:09 PM-Discussed treatment plan which includes lab work and speaking with TTS with pt at bedside and pt agreed to plan.   Labs Review Labs Reviewed  URINALYSIS, ROUTINE W REFLEX MICROSCOPIC - Abnormal; Notable for the following:    Specific Gravity, Urine 1.035 (*)    Glucose, UA >1000 (*)    All other components within normal limits  CBC WITH DIFFERENTIAL - Abnormal; Notable for the following:    Lymphs Abs 4.1 (*)    All other components within normal limits  COMPREHENSIVE METABOLIC PANEL - Abnormal; Notable for the following:    Sodium 130 (*)    Glucose, Bld 384 (*)    Total Protein 8.7 (*)    AST 56 (*)    ALT 73 (*)    All other components within normal limits  URINE RAPID DRUG SCREEN (HOSP PERFORMED) - Abnormal; Notable for the following:    Cocaine POSITIVE (*)    All other components within normal limits  CBG MONITORING, ED - Abnormal; Notable for the following:    Glucose-Capillary 360 (*)    All other components within normal limits  LIPASE, BLOOD  ETHANOL  URINE MICROSCOPIC-ADD ON    Imaging Review No results found.   EKG Interpretation None      MDM   Final diagnoses:  Uncomplicated alcohol dependence  Cocaine dependence without complication  Type II or unspecified type diabetes mellitus with unspecified complication, uncontrolled    48y/o male with EtOH and cocaine dependence, here for detox. Labs obtained and were significant for chronic hyperglycemia without ketonuria or signs of HHS/DKA as well as chronic AST/ALT elevation consistent with EtOH use but not concerning for hepatitis, and +cocaine but etoh <11. Psych nurse informing me that they will not take him for psych placement given he's not actively SI/HI. Medically  cleared, no acute withdrawal symptoms, discharged with resource guide and advised him to continue his home diabetes meds. I explained the diagnosis and have given explicit precautions to return to the ER including for any other new or worsening symptoms. The patient understands and accepts the medical plan as it's been dictated and I have answered their questions. Discharge instructions concerning home care and prescriptions have been given. The patient is STABLE and is discharged to home in good condition.   I personally performed the services described in this documentation, which was scribed in my presence. The recorded information has been reviewed and is accurate.  BP 140/95  Pulse 81  Temp(Src) 98.5 F (36.9 C) (Oral)  Resp 20  SpO2 96%   Madalyn Legner Strupp Camprubi-Soms, PA-C 10/29/13 1332

## 2013-10-29 NOTE — Discharge Instructions (Signed)
Restart your home medications for diabetes, and eat a diabetic friendly diet. Stop using cocaine as this is going to hurt your body and could kill you. Stop drinking alcohol as this is also hurting your body and your liver. Use the resource guide below to find a substance abuse specialist. Return to the ED for changes or worsening symptoms or concerns, including any homicidal or suicidal thoughts/actions.   High Blood Sugar High blood sugar (hyperglycemia) means that the level of sugar in your blood is higher than it should be. Signs of high blood sugar include:  Feeling thirsty.  Frequent peeing (urinating).  Feeling tired or sleepy.  Dry mouth.  Vision changes.  Feeling weak.  Feeling hungry but losing weight.  Numbness and tingling in your hands or feet.  Headache. When you ignore these signs, your blood sugar may keep going up. These problems may get worse, and other problems may begin. HOME CARE  Check your blood sugars as told by your doctor. Write down the numbers with the date and time.  Take the right amount of insulin or diabetes pills at the right time. Write down the dose with date and time.  Refill your insulin or diabetes pills before running out.  Watch what you eat. Follow your meal plan.  Drink liquids without sugar, such as water. Check with your doctor if you have kidney or heart disease.  Follow your doctor's orders for exercise. Exercise at the same time of day.  Keep your doctor's appointments. GET HELP RIGHT AWAY IF:   You have trouble thinking or are confused.  You have fast breathing with fruity smelling breath.  You pass out (faint).  You have 2 to 3 days of high blood sugars and you do not know why.  You have chest pain.  You are feeling sick to your stomach (nauseous) or throwing up (vomiting).  You have sudden vision changes. MAKE SURE YOU:   Understand these instructions.  Will watch your condition.  Will get help right away if  you are not doing well or get worse. Document Released: 12/11/2008 Document Revised: 05/08/2011 Document Reviewed: 12/11/2008 [facility] Patient Information 2015 [place], [place]. This information is not intended to replace advice given to you by your health care provider. Make sure you discuss any questions you have with your health care provider.  Type 2 Diabetes Mellitus Type 2 diabetes mellitus is a long-term (chronic) disease. In type 2 diabetes:  The pancreas does not make enough of a hormone called insulin.  The cells in the body do not respond as well to the insulin that is made.  Both of the above can happen. Normally, insulin moves sugars from food into tissue cells. This gives you energy. If you have type 2 diabetes, sugars cannot be moved into tissue cells. This causes high blood sugar (hyperglycemia).  HOME CARE  Have your hemoglobin A1c level checked twice a year. The level shows if your diabetes is under control or out of control.  Test your blood sugar level every day as told by your doctor.  Check your ketone levels by testing your pee (urine) when you are sick and as told.  Take your diabetes or insulin medicine as told by your doctor.  Never run out of insulin.  Adjust how much insulin you give yourself based on how many carbs (carbohydrates) you eat. Carbs are in many foods, such as fruits, vegetables, whole grains, and dairy products.  Have a healthy snack between every healthy meal. Have  3 meals and 3 snacks a day.  Lose weight if you are overweight.  Carry a medical alert card or wear your medical alert jewelry.  Carry a 15-gram carb snack with you at all times. Examples include:  Glucose pills, 3 or 4.  Glucose gel, 15-gram tube.  Raisins, 2 tablespoons (24 grams).  Jelly beans, 6.  Animal crackers, 8.  Regular (not diet) pop, 4 ounces (120 milliliters).  Gummy treats, 9.  Notice low blood sugar (hypoglycemia) symptoms, such as:  Shaking  (tremors).  Trouble thinking clearly.  Sweating.  Faster heart rate.  Headache.  Dry mouth.  Hunger.  Crabbiness (irritability).  Being worried or tense (anxious).  Restless sleep.  A change in speech or coordination.  Confusion.  Treat low blood sugar right away. If you are alert and can swallow, follow the 15:15 rule:  Take 15-20 grams of a rapid-acting glucose or carb. This includes glucose gel, glucose pills, or 4 ounces (120 milliliters) of fruit juice, regular pop, or low-fat milk.  Check your blood sugar level 15 minutes after taking the glucose.  Take 15-20 grams more of glucose if the repeat blood sugar level is still 70 mg/dL (milligrams/deciliter) or below.  Eat a meal or snack within 1 hour of the blood sugar levels going back to normal.  Notice early symptoms of high blood sugar, such as:  Being really thirsty or drinking a lot (polydipsia).  Peeing a lot (polyuria).  Do at least 150 minutes of physical activity a week or as told.  Split the 150 minutes of activity up during the week. Do not do 150 minutes of activity in one day.  Perform exercises, such as weight lifting, at least 2 times a week or as told.  Spend no more than 90 minutes at one time inactive.  Adjust your insulin or food intake as needed if you start a new exercise or sport.  Follow your sick-day plan when you are not able to eat or drink as usual.  Do not smoke, chew tobacco, or use electronic cigarettes.  Women who are not pregnant should drink no more than 1 drink a day. Men should drink no more than 2 drinks a day.  Only drink alcohol with food.  Ask your doctor if alcohol is safe for you.  Tell your doctor if you drink alcohol several times during the week.  See your doctor regularly.  Schedule an eye exam soon after you are told you have diabetes. Schedule exams once every year.  Check your skin and feet every day. Check for cuts, bruises, redness, nail problems,  bleeding, blisters, or sores. A doctor should do a foot exam once a year.  Brush your teeth and gums twice a day. Floss once a day. Visit your dentist regularly.  Share your diabetes plan with your workplace or school.  Stay up-to-date with shots that fight against diseases (immunizations).  Learn how to deal with stress.  Get diabetes education and support as needed.  Ask your doctor for special help if:  You need help to maintain or improve how you do things on your own.  You need help to maintain or improve the quality of your life.  You have foot or hand problems.  You have trouble cleaning yourself, dressing, eating, or doing physical activity. GET HELP IF:  You are unable to eat or drink for more than 6 hours.  You feel sick to your stomach (nauseous) or throw up (vomit) for more than 6  hours.  Your blood sugar level is over 240 mg/dL.  There is a change in mental status.  You get another serious illness.  You have watery poop (diarrhea) for more than 6 hours.  You have been sick or have had a fever for 2 or more days and are not getting better.  You have pain when you are active. GET HELP RIGHT AWAY IF:  You have trouble breathing.  Your ketone levels are higher than your doctor says they should be. MAKE SURE YOU:  Understand these instructions.  Will watch your condition.  Will get help right away if you are not doing well or get worse. Document Released: 11/23/2007 Document Revised: 06/30/2013 Document Reviewed: 09/15/2011 [facility] Patient Information 2015 [place], [place]. This information is not intended to replace advice given to you by your health care provider. Make sure you discuss any questions you have with your health care provider.  Stimulant Use Disorder-Cocaine Cocaine is one of a group of powerful drugs called stimulants. Cocaine has medical uses for stopping nosebleeds and for pain control before minor nose or dental surgery. However,  cocaine is misused because of the effects that it produces. These effects include:   A feeling of extreme pleasure.  Alertness.  High energy. Common Azalie Harbeck names for cocaine include coke, crack, blow, snow, and nose candy. Cocaine is snorted, dissolved in water and injected, or smoked.  Stimulants are addictive because they activate regions of the brain that produce both the pleasurable sensation of "reward" and psychological dependence. Together, these actions account for loss of control and the rapid development of drug dependence. This means you become ill without the drug (withdrawal) and need to keep using it to function.  Stimulant use disorder is use of stimulants that disrupts your daily life. It disrupts relationships with family and friends and how you do your job. Cocaine increases your blood pressure and heart rate. It can cause a heart attack or stroke. Cocaine can also cause death from irregular heart rate or seizures. SYMPTOMS Symptoms of stimulant use disorder with cocaine include:  Use of cocaine in larger amounts or over a longer period of time than intended.  Unsuccessful attempts to cut down or control cocaine use.  A lot of time spent obtaining, using, or recovering from the effects of cocaine.  A strong desire or urge to use cocaine (craving).  Continued use of cocaine in spite of major problems at work, school, or home because of use.  Continued use of cocaine in spite of relationship problems because of use.  Giving up or cutting down on important life activities because of cocaine use.  Use of cocaine over and over in situations when it is physically hazardous, such as driving a car.  Continued use of cocaine in spite of a physical problem that is likely related to use. Physical problems can include:  Malnutrition.  Nosebleeds.  Chest pain.  High blood pressure.  A hole that develops between the part of your nose that separates your nostrils (perforated  nasal septum).  Lung and kidney damage.  Continued use of cocaine in spite of a mental problem that is likely related to use. Mental problems can include:  Schizophrenia-like symptoms.  Depression.  Bipolar mood swings.  Anxiety.  Sleep problems.  Need to use more and more cocaine to get the same effect, or lessened effect over time with use of the same amount of cocaine (tolerance).  Having withdrawal symptoms when cocaine use is stopped, or using cocaine  to reduce or avoid withdrawal symptoms. Withdrawal symptoms include:  Depressed or irritable mood.  Low energy or restlessness.  Bad dreams.  Poor or excessive sleep.  Increased appetite. DIAGNOSIS Stimulant use disorder is diagnosed by your health care provider. You may be asked questions about your cocaine use and how it affects your life. A physical exam may be done. A drug screen may be ordered. You may be referred to a mental health professional. The diagnosis of stimulant use disorder requires at least two symptoms within 12 months. The type of stimulant use disorder depends on the number of signs and symptoms you have. The type may be:  Mild. Two or three signs and symptoms.  Moderate. Four or five signs and symptoms.  Severe. Six or more signs and symptoms. TREATMENT Treatment for stimulant use disorder is usually provided by mental health professionals with training in substance use disorders. The following options are available:  Counseling or talk therapy. Talk therapy addresses the reasons you use cocaine and ways to keep you from using again. Goals of talk therapy include:  Identifying and avoiding triggers for use.  Handling cravings.  Replacing use with healthy activities.  Support groups. Support groups provide emotional support, advice, and guidance.  Medicine. Certain medicines may decrease cocaine cravings or withdrawal symptoms. HOME CARE INSTRUCTIONS  Take medicines only as directed by your  health care provider.  Identify the people and activities that trigger your cocaine use and avoid them.  Keep all follow-up visits as directed by your health care provider. SEEK MEDICAL CARE IF:  Your symptoms get worse or you relapse.  You are not able to take medicines as directed. SEEK IMMEDIATE MEDICAL CARE IF:  You have serious thoughts about hurting yourself or others.  You have a seizure, chest pain, sudden weakness, or loss of speech or vision. FOR MORE INFORMATION  National Institute on Drug Abuse: http://www.price-smith.com/  Substance Abuse and Mental Health Services Administration: SkateOasis.com.pt Document Released: 02/11/2000 Document Revised: 06/30/2013 Document Reviewed: 02/26/2013 [facility] Patient Information 2015 [place], [place]. This information is not intended to replace advice given to you by your health care provider. Make sure you discuss any questions you have with your health care provider.  How Much is Too Much Alcohol? Drinking too much alcohol can cause injury, accidents, and health problems. These types of problems can include:   Car crashes.  Falls.  Family fighting (domestic violence).  Drowning.  Fights.  Injuries.  Burns.  Damage to certain organs.  Having a baby with birth defects. ONE DRINK CAN BE TOO MUCH WHEN YOU ARE:  Working.  Pregnant or breastfeeding.  Taking medicines. Ask your doctor.  Driving or planning to drive. WHAT IS A STANDARD DRINK?   1 regular beer (12 ounces or 360 milliliters).  1 glass of wine (5 ounces or 150 milliliters).  1 shot of liquor (1.5 ounces or 45 milliliters). BLOOD ALCOHOL LEVELS   .00 A person is sober.  Marland Kitchen03 A person has no trouble keeping balance, talking, or seeing right, but a "buzz" may be felt.  Marland Kitchen05 A person feels "buzzed" and relaxed.  Marland Kitchen08 or .10  A person is drunk. He or she has trouble talking, seeing right, and keeping his or her balance.  .15 A person loses body control and may  pass out (blackout).  .20 A person has trouble walking (staggering) and throws up (vomits).  .30 A person will pass out (unconscious).  .40+ A person will be in a coma. Death is  possible. If you or someone you know has a drinking problem, get help from a doctor.  Document Released: 12/10/2008 Document Revised: 05/08/2011 Document Reviewed: 12/10/2008 [facility] Patient Information 2015 [place], [place]. This information is not intended to replace advice given to you by your health care provider. Make sure you discuss any questions you have with your health care provider.  Emergency Department Resource Guide 1) Find a Doctor and Pay Out of Pocket Although you won't have to find out who is covered by your insurance plan, it is a good idea to ask around and get recommendations. You will then need to call the office and see if the doctor you have chosen will accept you as a new patient and what types of options they offer for patients who are self-pay. Some doctors offer discounts or will set up payment plans for their patients who do not have insurance, but you will need to ask so you aren't surprised when you get to your appointment.  2) Contact Your Local Health Department Not all health departments have doctors that can see patients for sick visits, but many do, so it is worth a call to see if yours does. If you don't know where your local health department is, you can check in your phone book. The CDC also has a tool to help you locate your state's health department, and many state websites also have listings of all of their local health departments.  3) Find a Walk-in Clinic If your illness is not likely to be very severe or complicated, you may want to try a walk in clinic. These are popping up all over the country in pharmacies, drugstores, and shopping centers. They're usually staffed by nurse practitioners or physician assistants that have been trained to treat common illnesses and complaints.  They're usually fairly quick and inexpensive. However, if you have serious medical issues or chronic medical problems, these are probably not your best option.  No Primary Care Doctor: - Call Health Connect at  [phone] - they can help you locate a primary care doctor that  accepts your insurance, provides certain services, etc. - Physician Referral Service- [phone]  Chronic Pain Problems: Organization         Address  Phone   Notes  [facility]  [phone] Patients need to be referred by their primary care doctor.   Medication Assistance: Organization         Address  Phone   Notes  [facility] Medication [facility] [address] [place] [place], [place] [address] [phone] --Must be a resident of [facility] -- Must have NO insurance coverage whatsoever (no Medicaid/ Medicare, etc.) -- The pt. MUST have a primary care doctor that directs their care regularly and follows them in the community   [facility]  [phone]   [employer]  [phone]    Agencies that provide inexpensive medical care: Organization         Address  Phone   Notes  [facility]  [phone]   [facility]    [phone]   [facility] [address] [place], [place] [address] [phone]   [facility] [address] [place] [phone]   [facility]anned Parenthood    [phone]   [facility]    [phone]   [facility] and [facility]  [address] [place], [place] Phone:  [phone])  [phone], Fax:  [phone] Hours of Operation:  9 am - 6 pm, M-F.  Also accepts Medicaid/Medicare and self-pay.  [facility]  [address], [place] Phone: [phone], Fax: [phone]. Hours of Operation:  8:30 am - 5:30 pm, M-F.  Also accepts Medicaid and self-pay.  [facility] [place] [address] [place]  Phone: [phone]   [facility] [address] Natasha Bence [place], [place] [phone], Ext. [phone] Mondays & Thursdays: 7-9 AM.  First 15 patients are seen on a first come, first serve basis.    Medicaid-accepting [facility] Providers:  Organization         Address  Phone   Notes  [facility] [address], [place] [phone] Also accepts self-pay patients.  [facility] [address] Laurell Josephs [place], [place]  [phone]   [facility] [address], [place] [phone]   [facility] [address] [place] [phone]   Renaye Rakers [address], [place]   [phone] Only accepts [place] patients after they have their name applied to their card.   Self-Pay (no insurance) in [facility]:  Organization         Address  Phone   Notes  Sickle Cell Patients, [facility] [address] [place], [place] [phone]   [facility] [address] [place], [place] [phone]   [facility] [place]  [address], [place] [phone]   [facility]/Dr. Osei-Bonsu  [address] [place] or [address], [place] [phone] Phone number for both [place] and [place] locations is the same.  [facility] [address] [place] [phone]   [facility] [address] [place] or [address] [phone]   [facility] [address] [place] [phone], phone; [phone], fax Sees patients 1st and 3rd Saturday of every month.  Must not qualify for public or private insurance (i.e. Medicaid, Medicare, [place] [facility] Choice, Veterans' Benefits)  Household income should be no more than 200% of the poverty level The clinic cannot  treat you if you are pregnant or think you are pregnant  Sexually transmitted diseases are not treated at the clinic.    Behavioral Health Resources in the Community: Intensive Outpatient Programs Organization         Address  Phone  Notes  [facility] [address], [place], [place] [phone]   [facility] Outpatient [address], [place], [place] [phone]   [facility] [address] [place], [place]  [phone]   [facility] Mental Health [address],  [place], [place] [phone] or [phone]   Substance Abuse Resources Organization         Address  Phone  Notes  [facility]  [phone]   [facility]  [phone]   [facility] [place]  [phone]   Floydene Flock  [phone]   Residential & Outpatient Substance Abuse Program  [phone]   Psychological Services Organization         Address  Phone  Notes  [facility]  [phone]   [facility]  [phone]   [facility] Mental Health [address] [place] [phone] or [phone]    Mobile Crisis Teams Organization         Address  Phone  Notes  [facility], Mobile Crisis Care Unit  [phone]   [facility]  [address] [place], [place] [phone]   Doristine Locks [address] [place] [phone]    Self-Help/Support Groups Organization         Address  Phone             Notes  [facility] [place] - variety of support groups  [phone]- [phone] Call for more information  Narcotics Anonymous (NA), [facility] [address], [employer] [place]  2 meetings at this location   Statistician         Address  Phone  Notes  [facility] [address],    [place]  [phone]   [facility]  [address], [place] [id number], [place], [place] [phone]     [facility] [address] [place], [place] [phone] Admissions: 8am-3pm M-F  [facility] [address]    [place], [place] [phone]   [facility] [address] [place], [place], [place] [phone]   [facility] [address]  [place], [place] [phone]   [facility] - Intensive Outpatient [address]ure[address] Joseph[address], [place], [place] [phone]   [facility] ([facility] [address]  [place], [place] [phone] or [phone]   [facility] ([facility]) [address] [place], [place] [phone] Accepts Medicaid  [facility] [address],  [place] [phone] Substance Abuse/Addiction Treatment   [facility] Organization         Address  Phone  Notes  [facility]  [phone]   Angie Fava, PhD [address] Ervin Knack [place], [place]   [phone] or [phone]   [facility] Behavioral   [address] [place], [place] [phone]   [facility] [address] [place], [place] [phone] Insurance/Medicaid/sponsorship through [facility] [address]                                    [place], [place] [phone] Therapy/tele-psych/case  [facility] [address], [place] [phone]    Dr. Lolly Mustache  [phone]   [facility]  [facility] Dept. 1) [address] [place], [place] 2) [address], [place] 3)  [address], [place] [phone]  [phone]   [facility] Child Abuse Hotline 612-683-0030 or [phone] (After Hours)

## 2013-10-29 NOTE — Progress Notes (Signed)
[facility] Community Liaison Stacy,  ° °Provided pt with a [facility] Orange Card application, highlighting [facility], to help patient establish primary care.  °

## 2013-10-29 NOTE — ED Notes (Signed)
Psych ED is going to look over chart.

## 2013-10-29 NOTE — ED Provider Notes (Signed)
Medical screening examination/treatment/procedure(s) were performed by non-physician practitioner and as supervising physician I was immediately available for consultation/collaboration.   EKG Interpretation None        Purvis Sheffield, MD 10/29/13 1545

## 2013-10-29 NOTE — ED Notes (Signed)
Pt upset that I am asking him questions that he already answered to someone else.  Pt states "I don't like redundancy and I'm already feeling some type of way".  Pt explained that this is the ER and not only will he be explaining his problems to me but will also be explaining his problems to PA, doc, counselor, psychiatry, etc.  Pt requesting etoh and cocaine detox.  Binge drinker.  Does not drink every day and cannot give a "normal amount" that he drinks per day.  Pt denies SI/HI.

## 2013-11-13 ENCOUNTER — Emergency Department (HOSPITAL_COMMUNITY): Payer: Medicaid Other

## 2013-11-13 ENCOUNTER — Ambulatory Visit (HOSPITAL_COMMUNITY)
Admission: RE | Admit: 2013-11-13 | Discharge: 2013-11-13 | Disposition: A | Discharge status: Home / Self Care | Payer: Medicaid Other | Attending: Psychiatry | Admitting: Psychiatry

## 2013-11-13 ENCOUNTER — Emergency Department (HOSPITAL_COMMUNITY)
Admission: EM | Admit: 2013-11-13 | Discharge: 2013-11-14 | Disposition: A | Discharge status: Home / Self Care | Payer: Federal, State, Local not specified - Other | Attending: Emergency Medicine | Admitting: Emergency Medicine

## 2013-11-13 ENCOUNTER — Encounter (HOSPITAL_COMMUNITY): Payer: Self-pay | Admitting: Emergency Medicine

## 2013-11-13 DIAGNOSIS — R55 Syncope and collapse: Secondary | ICD-10-CM | POA: Insufficient documentation

## 2013-11-13 DIAGNOSIS — I10 Essential (primary) hypertension: Secondary | ICD-10-CM | POA: Insufficient documentation

## 2013-11-13 DIAGNOSIS — F319 Bipolar disorder, unspecified: Secondary | ICD-10-CM | POA: Insufficient documentation

## 2013-11-13 DIAGNOSIS — E119 Type 2 diabetes mellitus without complications: Secondary | ICD-10-CM | POA: Insufficient documentation

## 2013-11-13 DIAGNOSIS — Z79899 Other long term (current) drug therapy: Secondary | ICD-10-CM | POA: Insufficient documentation

## 2013-11-13 DIAGNOSIS — F1420 Cocaine dependence, uncomplicated: Secondary | ICD-10-CM | POA: Diagnosis present

## 2013-11-13 DIAGNOSIS — Z794 Long term (current) use of insulin: Secondary | ICD-10-CM | POA: Insufficient documentation

## 2013-11-13 DIAGNOSIS — F1020 Alcohol dependence, uncomplicated: Secondary | ICD-10-CM | POA: Insufficient documentation

## 2013-11-13 DIAGNOSIS — F1994 Other psychoactive substance use, unspecified with psychoactive substance-induced mood disorder: Secondary | ICD-10-CM | POA: Diagnosis present

## 2013-11-13 DIAGNOSIS — F17200 Nicotine dependence, unspecified, uncomplicated: Secondary | ICD-10-CM | POA: Insufficient documentation

## 2013-11-13 DIAGNOSIS — Z008 Encounter for other general examination: Secondary | ICD-10-CM | POA: Insufficient documentation

## 2013-11-13 LAB — COMPREHENSIVE METABOLIC PANEL
ALT: 68 U/L — ABNORMAL HIGH (ref 0–53)
AST: 49 U/L — ABNORMAL HIGH (ref 0–37)
Albumin: 3.6 g/dL (ref 3.5–5.2)
Alkaline Phosphatase: 89 U/L (ref 39–117)
Anion gap: 13 (ref 5–15)
BUN: 17 mg/dL (ref 6–23)
CALCIUM: 9.2 mg/dL (ref 8.4–10.5)
CO2: 23 mEq/L (ref 19–32)
Chloride: 103 mEq/L (ref 96–112)
Creatinine, Ser: 0.8 mg/dL (ref 0.50–1.35)
GFR calc non Af Amer: >90 mL/min (ref >90)
GLUCOSE: 167 mg/dL — AB (ref 70–99)
Potassium: 4.3 mEq/L (ref 3.7–5.3)
Sodium: 139 mEq/L (ref 137–147)
TOTAL PROTEIN: 7.9 g/dL (ref 6.0–8.3)
Total Bilirubin: 0.4 mg/dL (ref 0.3–1.2)

## 2013-11-13 LAB — TROPONIN I: Troponin I: <0.3 ng/mL (ref <0.30)

## 2013-11-13 LAB — CBC WITH DIFFERENTIAL/PLATELET
BASOS PCT: 0 % (ref 0–1)
Basophils Absolute: 0 10*3/uL (ref 0.0–0.1)
EOS ABS: 0.1 10*3/uL (ref 0.0–0.7)
EOS PCT: 1 % (ref 0–5)
HEMATOCRIT: 43.1 % (ref 39.0–52.0)
Hemoglobin: 14.9 g/dL (ref 13.0–17.0)
LYMPHS ABS: 5.5 10*3/uL — AB (ref 0.7–4.0)
Lymphocytes Relative: 53 % — ABNORMAL HIGH (ref 12–46)
MCH: 31.2 pg (ref 26.0–34.0)
MCHC: 34.6 g/dL (ref 30.0–36.0)
MCV: 90.2 fL (ref 78.0–100.0)
MONO ABS: 0.8 10*3/uL (ref 0.1–1.0)
Monocytes Relative: 8 % (ref 3–12)
Neutro Abs: 3.8 10*3/uL (ref 1.7–7.7)
Neutrophils Relative %: 38 % — ABNORMAL LOW (ref 43–77)
Platelets: 216 10*3/uL (ref 150–400)
RBC: 4.78 MIL/uL (ref 4.22–5.81)
RDW: 13.3 % (ref 11.5–15.5)
WBC: 10.2 10*3/uL (ref 4.0–10.5)

## 2013-11-13 LAB — ETHANOL: Alcohol, Ethyl (B): <11 mg/dL (ref 0–11)

## 2013-11-13 LAB — RAPID URINE DRUG SCREEN, HOSP PERFORMED
Amphetamines: NOT DETECTED
BARBITURATES: NOT DETECTED
Benzodiazepines: NOT DETECTED
COCAINE: POSITIVE — AB
OPIATES: NOT DETECTED
TETRAHYDROCANNABINOL: NOT DETECTED

## 2013-11-13 LAB — CBG MONITORING, ED: GLUCOSE-CAPILLARY: 354 mg/dL — AB (ref 70–99)

## 2013-11-13 MED ORDER — LORAZEPAM 1 MG PO TABS
2.0000 mg | ORAL_TABLET | Freq: Once | ORAL | Status: AC
  Administered 2013-11-13: 2 mg via ORAL
  Filled 2013-11-13: qty 2

## 2013-11-13 MED ORDER — LORAZEPAM 1 MG PO TABS: 0.0000 mg | ORAL_TABLET | Freq: Four times a day (QID) | ORAL | Status: DC

## 2013-11-13 MED ORDER — INSULIN ASPART 100 UNIT/ML ~~LOC~~ SOLN
0.0000 [IU] | SUBCUTANEOUS | Status: DC
  Administered 2013-11-14: 4 [IU] via SUBCUTANEOUS
  Filled 2013-11-13: qty 1

## 2013-11-13 MED ORDER — IBUPROFEN 200 MG PO TABS: 600.0000 mg | ORAL_TABLET | Freq: Three times a day (TID) | ORAL | Status: DC | PRN

## 2013-11-13 MED ORDER — ACETAMINOPHEN 325 MG PO TABS: 650.0000 mg | ORAL_TABLET | ORAL | Status: DC | PRN

## 2013-11-13 MED ORDER — GABAPENTIN 100 MG PO CAPS
100.0000 mg | ORAL_CAPSULE | Freq: Three times a day (TID) | ORAL | Status: DC
  Administered 2013-11-13 – 2013-11-14 (×2): 100 mg via ORAL
  Filled 2013-11-13 (×2): qty 1

## 2013-11-13 MED ORDER — INSULIN GLARGINE 100 UNIT/ML ~~LOC~~ SOLN
52.0000 [IU] | Freq: Every day | SUBCUTANEOUS | Status: DC
  Administered 2013-11-13: 52 [IU] via SUBCUTANEOUS
  Filled 2013-11-13: qty 0.52

## 2013-11-13 MED ORDER — GLIMEPIRIDE 2 MG PO TABS
2.0000 mg | ORAL_TABLET | Freq: Every day | ORAL | Status: DC
  Administered 2013-11-14: 2 mg via ORAL
  Filled 2013-11-13 (×2): qty 1

## 2013-11-13 MED ORDER — NICOTINE 21 MG/24HR TD PT24
21.0000 mg | MEDICATED_PATCH | Freq: Every day | TRANSDERMAL | Status: DC
  Administered 2013-11-13 – 2013-11-14 (×2): 21 mg via TRANSDERMAL
  Filled 2013-11-13 (×2): qty 1

## 2013-11-13 MED ORDER — ONDANSETRON HCL 4 MG PO TABS: 4.0000 mg | ORAL_TABLET | Freq: Three times a day (TID) | ORAL | Status: DC | PRN

## 2013-11-13 MED ORDER — ALUM & MAG HYDROXIDE-SIMETH 200-200-20 MG/5ML PO SUSP: 30.0000 mL | ORAL | Status: DC | PRN

## 2013-11-13 MED ORDER — METFORMIN HCL 500 MG PO TABS
1000.0000 mg | ORAL_TABLET | Freq: Two times a day (BID) | ORAL | Status: DC
  Administered 2013-11-14: 1000 mg via ORAL
  Filled 2013-11-13 (×3): qty 2

## 2013-11-13 MED ORDER — TRAZODONE HCL 50 MG PO TABS
50.0000 mg | ORAL_TABLET | Freq: Every evening | ORAL | Status: DC | PRN
  Administered 2013-11-13: 50 mg via ORAL
  Filled 2013-11-13: qty 1

## 2013-11-13 MED ORDER — LISINOPRIL 5 MG PO TABS
5.0000 mg | ORAL_TABLET | Freq: Every day | ORAL | Status: DC
  Administered 2013-11-13 – 2013-11-14 (×2): 5 mg via ORAL
  Filled 2013-11-13 (×4): qty 1

## 2013-11-13 MED ORDER — LORAZEPAM 1 MG PO TABS: 0.0000 mg | ORAL_TABLET | Freq: Two times a day (BID) | ORAL | Status: DC

## 2013-11-13 NOTE — ED Notes (Addendum)
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 Pt reports that his last use of alcohol and cocaine was around 2 am this morning. Pt reports that he is not having any SI or HI today but has had some in the past related to his alcohol and drug abuse. Pt was referred here from [facility] for medical clearance, BH reported no beds at this time.

## 2013-11-13 NOTE — ED Notes (Signed)
Behavioral health assessment team member in to speak with patient. Pt cooperative at this time.

## 2013-11-13 NOTE — ED Notes (Signed)
Patient anxious, restless and with noted tremors. Pt cooperative and pleasant with staff. Pt's wife in room to visit. No s/s of complications or distress noted.

## 2013-11-13 NOTE — ED Notes (Signed)
Pt aware urine sample is needed 

## 2013-11-13 NOTE — ED Notes (Signed)
Bed: [facility] Expected date:  Expected time:  Means of arrival:  Comments: 

## 2013-11-13 NOTE — BH Assessment (Signed)
Patient seen earlier today as Walk in at [facility] at 2:00 PM requesting detox from Alcohol and Cocaine. Accompanied by wife and both report that following detox at [facility] in February 2015 patient remained clean for 4 months yet since relapsing in June his use hand frequency have increased causing inability to work and discord in relationship.  Patient reports following [facility] discharge he was to go to [facility] yet was unable due to medications for his diabetes not being covered. Patient not willing to contact [facility] again.  He did follow up with AA and NA and reports that he was able to maintain sobriety while attending meetings. Patient has no current therapist nor medication management yet presents with anxiety and depression.   Patient discussed with Serena Colonel, NP. No beds currently available at [facility]; patient driven to [facility] by his wife at her request for medical clearance and disposition.   Carney Bern, LCSW
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
## 2013-11-13 NOTE — ED Provider Notes (Signed)
CSN: [id number]     Arrival date & time 11/13/13  1518 History   First MD Initiated Contact with Patient 11/13/13 1626     Chief Complaint  Patient presents with  . Detox      (Consider location/radiation/quality/duration/timing/severity/associated sxs/prior Treatment) The history is provided by the patient.   patient presents requesting detox off of alcohol and cocaine. States that he drank 4 40s and 2 bottles of wine last night. He states that it is a light night for him. He also does $150-$200 of cocaine a day. He snorts it. He has a history of depression mostly due to his substance abuse. No current suicidal or homicidal thoughts. States he was seen at behavioral health and sent over here. Reportedly there are no beds available there. He has been coughing. No chest pain. He did have an episode of passing out last night.  Past Medical History  Diagnosis Date  . Diabetes mellitus without complication   . Hypercholesteremia   . Manic depression   . Hypertension    Past Surgical History  Procedure Laterality Date  . Total hip arthroplasty     No family history on file. History  Substance Use Topics  . Smoking status: Current Every Day Smoker -- 0.50 packs/day    Types: Cigarettes  . Smokeless tobacco: Not on file  . Alcohol Use: 14.4 oz/week    24 Cans of beer per week     Comment: daily     Review of Systems  Constitutional: Negative for activity change and appetite change.  Eyes: Negative for pain.  Respiratory: Negative for chest tightness and shortness of breath.   Cardiovascular: Negative for chest pain and leg swelling.  Gastrointestinal: Negative for nausea, vomiting, abdominal pain and diarrhea.  Genitourinary: Negative for flank pain.  Musculoskeletal: Negative for back pain and neck stiffness.  Skin: Negative for rash.  Neurological: Positive for syncope. Negative for weakness, numbness and headaches.  Psychiatric/Behavioral: Positive for dysphoric mood.  Negative for behavioral problems.      Allergies  Review of patient's allergies indicates no known allergies.  Home Medications   Prior to Admission medications   Medication Sig Start Date End Date Taking? Authorizing Provider  gabapentin (NEURONTIN) 100 MG capsule Take 1 capsule (100 mg total) by mouth 3 (three) times daily. For substance withdrawal syndrome 04/28/13  Yes Sanjuana Kava, NP  glimepiride (AMARYL) 2 MG tablet Take 1 tablet (2 mg total) by mouth daily with breakfast. For diabetes management 04/28/13  Yes Sanjuana Kava, NP  insulin aspart (NOVOLOG) 100 UNIT/ML injection Sliding scale as directed by medical physician. Per Patient uses sliding scale at home 04/28/13  Yes Rachael Fee, MD  insulin glargine (LANTUS) 100 UNIT/ML injection Inject 0.52 mLs (52 Units total) into the skin at bedtime. 04/27/13  Yes Truman Hayward, FNP  lisinopril (PRINIVIL,ZESTRIL) 5 MG tablet Take 1 tablet (5 mg total) by mouth daily. For hypertension 04/23/13  Yes Sanjuana Kava, NP  metFORMIN (GLUCOPHAGE) 500 MG tablet Take 2 tablets (1,000 mg total) by mouth 2 (two) times daily with a meal. For diabetes 04/28/13  Yes Rachael Fee, MD  traZODone (DESYREL) 50 MG tablet Take 1 tablet (50 mg total) by mouth at bedtime as needed for sleep. 04/23/13  Yes Sanjuana Kava, NP   BP 147/91  Pulse 60  Temp(Src) 98.7 F (37.1 C) (Oral)  Resp 20  SpO2 100% Physical Exam  Nursing note and vitals reviewed. Constitutional: He is oriented  to person, place, and time. He appears well-developed and well-nourished.  HENT:  Head: Normocephalic and atraumatic.  Eyes: EOM are normal. Pupils are equal, round, and reactive to light.  Neck: Normal range of motion. Neck supple.  Cardiovascular: Normal rate, regular rhythm and normal heart sounds.   No murmur heard. Pulmonary/Chest: Effort normal and breath sounds normal.  Abdominal: Soft. Bowel sounds are normal. He exhibits no distension and no mass. There is no tenderness.  There is no rebound and no guarding.  Musculoskeletal: Normal range of motion. He exhibits no edema.  Neurological: He is alert and oriented to person, place, and time. No cranial nerve deficit.  Skin: Skin is warm and dry.  Psychiatric: He has a normal mood and affect.    ED Course  Procedures (including critical care time) Labs Review Labs Reviewed  CBC WITH DIFFERENTIAL - Abnormal; Notable for the following:    Neutrophils Relative % 38 (*)    Lymphocytes Relative 53 (*)    Lymphs Abs 5.5 (*)    All other components within normal limits  COMPREHENSIVE METABOLIC PANEL - Abnormal; Notable for the following:    Glucose, Bld 167 (*)    AST 49 (*)    ALT 68 (*)    All other components within normal limits  URINE RAPID DRUG SCREEN (HOSP PERFORMED) - Abnormal; Notable for the following:    Cocaine POSITIVE (*)    All other components within normal limits  CBG MONITORING, ED - Abnormal; Notable for the following:    Glucose-Capillary 354 (*)    All other components within normal limits  TROPONIN I  ETHANOL    Imaging Review Dg Chest 2 View  11/13/2013   CLINICAL DATA:  Chest pain, cough, congestion  EXAM: CHEST  2 VIEW  COMPARISON:  None.  FINDINGS: Normal cardiac silhouette. Low lung volumes with bibasilar atelectasis. No effusion, infiltrate, or pneumothorax.  IMPRESSION: Mild basilar atelectasis.  No edema or infiltrate.   Electronically Signed   By: Genevive Bi M.D.   On: 11/13/2013 18:03     EKG Interpretation None      MDM   Final diagnoses:  Uncomplicated alcohol dependence  Cocaine dependence without complication    Patient with alcohol and cocaine abuse. History of same. Not suicidal or homicidal. He appears to medically cleared. Will be seen by TTS    Juliet Rude. Rubin Payor, MD 11/13/13 [id number]

## 2013-11-14 ENCOUNTER — Encounter (HOSPITAL_COMMUNITY): Payer: Self-pay | Admitting: Psychiatry

## 2013-11-14 DIAGNOSIS — F1994 Other psychoactive substance use, unspecified with psychoactive substance-induced mood disorder: Secondary | ICD-10-CM

## 2013-11-14 DIAGNOSIS — F1910 Other psychoactive substance abuse, uncomplicated: Secondary | ICD-10-CM

## 2013-11-14 LAB — CBG MONITORING, ED: Glucose-Capillary: 187 mg/dL — ABNORMAL HIGH (ref 70–99)

## 2013-11-14 NOTE — Discharge Instructions (Signed)
Chemical Dependency  Chemical dependency is an addiction to drugs or alcohol. It is characterized by the repeated behavior of seeking out and using drugs and alcohol despite harmful consequences to the health and safety of ones self and others.   RISK FACTORS  There are certain situations or behaviors that increase a person's risk for chemical dependency. These include:  · A family history of chemical dependency.  · A history of mental health issues, including depression and anxiety.  · A home environment where drugs and alcohol are easily available to you.  · Drug or alcohol use at a young age.  SYMPTOMS   The following symptoms can indicate chemical dependency:  · Inability to limit the use of drugs or alcohol.  · Nausea, sweating, shakiness, and anxiety that occurs when alcohol or drugs are not being used.  · An increase in amount of drugs or alcohol that is necessary to get drunk or high.  People who experience these symptoms can assess their use of drugs and alcohol by asking themselves the following questions:  · Have you been told by friends or family that they are worried about your use of alcohol or drugs?  · Do friends and family ever tell you about things you did while drinking alcohol or using drugs that you do not remember?  · Do you lie about using alcohol or drugs or about the amounts you use?  · Do you have difficulty completing daily tasks unless you use alcohol or drugs?  · Is the level of your work or school performance lower because of your drug or alcohol use?  · Do you get sick from using drugs or alcohol but keep using anyway?  · Do you feel uncomfortable in social situations unless you use alcohol or drugs?  · Do you use drugs or alcohol to help forget problems?   An answer of yes to any of these questions may indicate chemical dependency. Professional evaluation is suggested.  Document Released: 02/07/2001 Document Revised: 05/08/2011 Document Reviewed: 04/21/2010  Exit[employer]® Patient  Information ©2015 [employer]. This information is not intended to replace advice given to you by your health care provider. Make sure you discuss any questions you have with your health care provider.

## 2013-11-14 NOTE — ED Notes (Signed)
Pt rates his depression and hopelessness as a 6 on 1-10 scale with 10 being the most. Pt denies si and hi.

## 2013-11-14 NOTE — Progress Notes (Signed)
[facility] Community Liaison [place],  [place] with patient about [employer]. Patient currently has card with [facility] in [place]. Provided pt with [facility] contact information and new address at [address] . Patient was aware of pcp address change. Also, provided pt with [facility] [place] information for outpatient mental health resources.

## 2013-11-14 NOTE — Consult Note (Signed)
[facility] Face-to-Face Psychiatry Consult   Reason for Consult:  Cocaine dependency Referring Physician:  EDP  Stephen Guzman is an 48 y.o. male. Total Time spent with patient: 20 minutes  Assessment: AXIS I:  Substance Abuse and Substance Induced Mood Disorder AXIS II:  Deferred AXIS III:   Past Medical History  Diagnosis Date  . Diabetes mellitus without complication   . Hypercholesteremia   . Manic depression   . Hypertension    AXIS IV:  other psychosocial or environmental problems, problems related to social environment and problems with primary support group AXIS V:  61-70 mild symptoms  Plan:  No evidence of imminent risk to self or others at present.  Dr. Darleene Guzman assessed the patient and concurs with the plan.  Subjective:   Stephen Guzman is a 48 y.o. male patient does not warrant admission.  HPI:  The patient requested cocaine detox but refused [facility] and [facility], irritable on assessment.  He has been using $100/200 a day for 2-3 times a week.  Beds are available at [facility] in [place] and patient given information.  He just needs to transport himself there, his wife is at his bedside.  The wife had called the nurse and stated he was abusive towards her and she was planning on leaving him but then came to the ED, supportive at his bedside.  Denies suicidal/homicidal ideations, hallucinations.  Uses alcohol sporadically, not on a regular basis.   HPI Elements:   Location:  generalized. Quality:  chronic. Severity:  mild. Timing:  intermittent. Duration:  years. Context:  stressors.  Past Psychiatric History: Past Medical History  Diagnosis Date  . Diabetes mellitus without complication   . Hypercholesteremia   . Manic depression   . Hypertension     reports that he has been smoking Cigarettes.  He has been smoking about 0.50 packs per day. He does not have any smokeless tobacco history on file. He reports that he drinks about 14.4 ounces of alcohol per week. He reports  that he uses illicit drugs (Cocaine). History reviewed. No pertinent family history. Family History Substance Abuse: Yes, Describe: (Both parents and 2 siblings in addition to extended family m) Family Supports: Yes, List: (Wife) Living Arrangements: Spouse/significant other Can pt return to current living arrangement?: Yes Abuse/Neglect [facility]) Physical Abuse: Denies Verbal Abuse: Yes, past (Comment) (As a child as both parents were alcoholic and verbally and emotionally abussive towards patient) Sexual Abuse: Denies Allergies:  No Known Allergies  ACT Assessment Complete:  Yes:    Educational Status    Risk to Self: Risk to self with the past 6 months Suicidal Ideation: No Suicidal Intent: No Is patient at risk for suicide?: No Suicidal Plan?: No What has been your use of drugs/alcohol within the last 12 months?: Twice weekly uses Alcohol and Cocaine to point of blackout Previous Attempts/Gestures: No How many times?: 0 Family Suicide History: No Recent stressful life event(s): Job Loss;Conflict (Comment) (Job loss in Feb 2440; marital conflict due to pt's substance) Persecutory voices/beliefs?: No Depression: Yes Depression Symptoms: Insomnia;Tearfulness;Isolating;Fatigue;Guilt;Loss of interest in usual pleasures;Feeling worthless/self pity;Feeling angry/irritable Substance abuse history and/or treatment for substance abuse?: Yes Suicide prevention information given to non-admitted patients: Not applicable  Risk to Others: Risk to Others within the past 6 months Homicidal Ideation: No Thoughts of Harm to Others: No Current Homicidal Intent: No Current Homicidal Plan: No Identified Victim: None History of harm to others?: Yes (Assualt charges 2009) Assessment of Violence: None Noted Does patient have  access to weapons?: No Criminal Charges Pending?: No Does patient have a court date: No  Abuse: Abuse/Neglect Assessment (Assessment to be complete while patient is  alone) Physical Abuse: Denies Verbal Abuse: Yes, past (Comment) (As a child as both parents were alcoholic and verbally and emotionally abussive towards patient) Sexual Abuse: Denies Exploitation of patient/patient's resources: Denies Self-Neglect: Denies  Prior Inpatient Therapy: Prior Inpatient Therapy Prior Inpatient Therapy: Yes Prior Therapy Dates: February 22 - April 28 2013 Prior Therapy Facilty/Provider(s): [facility] Reason for Treatment: Substance Abuse  Prior Outpatient Therapy: Prior Outpatient Therapy Prior Outpatient Therapy: Yes Prior Therapy Dates: 2012 Prior Therapy Facilty/Provider(s): Cannot recall; services in [place] Reason for Treatment: Depression, Anxiety  Additional Information: Additional Information 1:1 In Past 12 Months?: No CIRT Risk: No Elopement Risk: No Does patient have medical clearance?: No                  Objective: Blood pressure 116/71, pulse 71, temperature 98.1 F (36.7 C), temperature source Oral, resp. rate 16, SpO2 98.00%.There is no weight on file to calculate BMI. Results for orders placed during the hospital encounter of 11/13/13 (from the past 72 hour(s))  CBC WITH DIFFERENTIAL     Status: Abnormal   Collection Time    11/13/13  5:07 PM      Result Value Ref Range   WBC 10.2  4.0 - 10.5 K/uL   RBC 4.78  4.22 - 5.81 MIL/uL   Hemoglobin 14.9  13.0 - 17.0 g/dL   HCT 43.1  39.0 - 52.0 %   MCV 90.2  78.0 - 100.0 fL   MCH 31.2  26.0 - 34.0 pg   MCHC 34.6  30.0 - 36.0 g/dL   RDW 13.3  11.5 - 15.5 %   Platelets 216  150 - 400 K/uL   Neutrophils Relative % 38 (*) 43 - 77 %   Neutro Abs 3.8  1.7 - 7.7 K/uL   Lymphocytes Relative 53 (*) 12 - 46 %   Lymphs Abs 5.5 (*) 0.7 - 4.0 K/uL   Monocytes Relative 8  3 - 12 %   Monocytes Absolute 0.8  0.1 - 1.0 K/uL   Eosinophils Relative 1  0 - 5 %   Eosinophils Absolute 0.1  0.0 - 0.7 K/uL   Basophils Relative 0  0 - 1 %   Basophils Absolute 0.0  0.0 - 0.1 K/uL  TROPONIN I     Status: None    Collection Time    11/13/13  5:07 PM      Result Value Ref Range   Troponin I <0.30  <0.30 ng/mL   Comment:            Due to the release kinetics of cTnI,     a negative result within the first hours     of the onset of symptoms does not rule out     myocardial infarction with certainty.     If myocardial infarction is still suspected,     repeat the test at appropriate intervals.  COMPREHENSIVE METABOLIC PANEL     Status: Abnormal   Collection Time    11/13/13  5:07 PM      Result Value Ref Range   Sodium 139  137 - 147 mEq/L   Potassium 4.3  3.7 - 5.3 mEq/L   Chloride 103  96 - 112 mEq/L   CO2 23  19 - 32 mEq/L   Glucose, Bld 167 (*) 70 - 99 mg/dL  BUN 17  6 - 23 mg/dL   Creatinine, Ser 0.80  0.50 - 1.35 mg/dL   Calcium 9.2  8.4 - 10.5 mg/dL   Total Protein 7.9  6.0 - 8.3 g/dL   Albumin 3.6  3.5 - 5.2 g/dL   AST 49 (*) 0 - 37 U/L   Comment: SLIGHT HEMOLYSIS     HEMOLYSIS AT THIS LEVEL MAY AFFECT RESULT   ALT 68 (*) 0 - 53 U/L   Alkaline Phosphatase 89  39 - 117 U/L   Total Bilirubin 0.4  0.3 - 1.2 mg/dL   GFR calc non Af Amer >90  >90 mL/min   GFR calc Af Amer >90  >90 mL/min   Comment: (NOTE)     The eGFR has been calculated using the CKD EPI equation.     This calculation has not been validated in all clinical situations.     eGFR's persistently <90 mL/min signify possible Chronic Kidney     Disease.   Anion gap 13  5 - 15  ETHANOL     Status: None   Collection Time    11/13/13  5:07 PM      Result Value Ref Range   Alcohol, Ethyl (B) <11  0 - 11 mg/dL   Comment:            LOWEST DETECTABLE LIMIT FOR     SERUM ALCOHOL IS 11 mg/dL     FOR MEDICAL PURPOSES ONLY  URINE RAPID DRUG SCREEN (HOSP PERFORMED)     Status: Abnormal   Collection Time    11/13/13  5:34 PM      Result Value Ref Range   Opiates NONE DETECTED  NONE DETECTED   Cocaine POSITIVE (*) NONE DETECTED   Benzodiazepines NONE DETECTED  NONE DETECTED   Amphetamines NONE DETECTED  NONE DETECTED    Tetrahydrocannabinol NONE DETECTED  NONE DETECTED   Barbiturates NONE DETECTED  NONE DETECTED   Comment:            DRUG SCREEN FOR MEDICAL PURPOSES     ONLY.  IF CONFIRMATION IS NEEDED     FOR ANY PURPOSE, NOTIFY LAB     WITHIN 5 DAYS.                LOWEST DETECTABLE LIMITS     FOR URINE DRUG SCREEN     Drug Class       Cutoff (ng/mL)     Amphetamine      1000     Barbiturate      200     Benzodiazepine   062     Tricyclics       694     Opiates          300     Cocaine          300     THC              50  CBG MONITORING, ED     Status: Abnormal   Collection Time    11/13/13  9:07 PM      Result Value Ref Range   Glucose-Capillary 354 (*) 70 - 99 mg/dL   Comment 1 Notify RN    CBG MONITORING, ED     Status: Abnormal   Collection Time    11/14/13  4:03 AM      Result Value Ref Range   Glucose-Capillary 187 (*) 70 - 99 mg/dL   Labs are reviewed  and are pertinent for no medical issues needing attention.  Current Facility-Administered Medications  Medication Dose Route Frequency Provider Last Rate Last Dose  . acetaminophen (TYLENOL) tablet 650 mg  650 mg Oral Q4H PRN Jasper Riling. Pickering, MD      . alum & mag hydroxide-simeth (MAALOX/MYLANTA) 200-200-20 MG/5ML suspension 30 mL  30 mL Oral PRN Jasper Riling. Pickering, MD      . gabapentin (NEURONTIN) capsule 100 mg  100 mg Oral TID Jasper Riling. Pickering, MD   100 mg at 11/14/13 1010  . glimepiride (AMARYL) tablet 2 mg  2 mg Oral Q breakfast Jasper Riling. Pickering, MD   2 mg at 11/14/13 0839  . ibuprofen (ADVIL,MOTRIN) tablet 600 mg  600 mg Oral Q8H PRN Jasper Riling. Pickering, MD      . insulin aspart (novoLOG) injection 0-24 Units  0-24 Units Subcutaneous 6 times per day Jasper Riling. Alvino Chapel, MD   4 Units at 11/14/13 0840  . insulin glargine (LANTUS) injection 52 Units  52 Units Subcutaneous QHS Nathan R. Alvino Chapel, MD   52 Units at 11/13/13 2152  . lisinopril (PRINIVIL,ZESTRIL) tablet 5 mg  5 mg Oral Daily Jasper Riling. Pickering, MD   5 mg  at 11/14/13 1010  . LORazepam (ATIVAN) tablet 0-4 mg  0-4 mg Oral 4 times per day Jasper Riling. Alvino Chapel, MD       Followed by  . [START ON 11/15/2013] LORazepam (ATIVAN) tablet 0-4 mg  0-4 mg Oral Q12H Nathan R. Pickering, MD      . metFORMIN (GLUCOPHAGE) tablet 1,000 mg  1,000 mg Oral BID WC Nathan R. Pickering, MD   1,000 mg at 11/14/13 0839  . nicotine (NICODERM CQ - dosed in mg/24 hours) patch 21 mg  21 mg Transdermal Daily Nathan R. Pickering, MD   21 mg at 11/14/13 0859  . ondansetron (ZOFRAN) tablet 4 mg  4 mg Oral Q8H PRN Jasper Riling. Pickering, MD      . traZODone (DESYREL) tablet 50 mg  50 mg Oral QHS PRN Jasper Riling. Pickering, MD   50 mg at 11/13/13 2117   Current Outpatient Prescriptions  Medication Sig Dispense Refill  . gabapentin (NEURONTIN) 100 MG capsule Take 1 capsule (100 mg total) by mouth 3 (three) times daily. For substance withdrawal syndrome  90 capsule  0  . glimepiride (AMARYL) 2 MG tablet Take 1 tablet (2 mg total) by mouth daily with breakfast. For diabetes management  30 tablet  0  . insulin aspart (NOVOLOG) 100 UNIT/ML injection Sliding scale as directed by medical physician. Per Patient uses sliding scale at home  10 mL  11  . insulin glargine (LANTUS) 100 UNIT/ML injection Inject 0.52 mLs (52 Units total) into the skin at bedtime.  10 mL  11  . lisinopril (PRINIVIL,ZESTRIL) 5 MG tablet Take 1 tablet (5 mg total) by mouth daily. For hypertension  30 tablet  0  . metFORMIN (GLUCOPHAGE) 500 MG tablet Take 2 tablets (1,000 mg total) by mouth 2 (two) times daily with a meal. For diabetes  120 tablet  0  . traZODone (DESYREL) 50 MG tablet Take 1 tablet (50 mg total) by mouth at bedtime as needed for sleep.  30 tablet  0    Psychiatric Specialty Exam:     Blood pressure 116/71, pulse 71, temperature 98.1 F (36.7 C), temperature source Oral, resp. rate 16, SpO2 98.00%.There is no weight on file to calculate BMI.  General Appearance: Casual  Eye Contact::  Good  Speech:  Normal Rate  Volume:  Normal  Mood:  Irritable  Affect:  Congruent  Thought Process:  Coherent  Orientation:  Full (Time, Place, and Person)  Thought Content:  WDL  Suicidal Thoughts:  No  Homicidal Thoughts:  No  Memory:  Immediate;   Good Recent;   Good Remote;   Good  Judgement:  Fair  Insight:  Fair  Psychomotor Activity:  Normal  Concentration:  Good  Recall:  Good  Fund of Knowledge:Good  Language: Good  Akathisia:  No  Handed:  Right  AIMS (if indicated):     Assets:  Financial Resources/Insurance Housing Leisure Time Physical Health Resilience Social Support Transportation  Sleep:      Musculoskeletal: Strength & Muscle Tone: within normal limits Gait & Station: normal Patient leans: N/A  Treatment Plan Summary: Discharge with referral to [facility] for cocaine dependency.  Waylan Boga, [place] 11/14/2013 11:36 AM  Patient seen, evaluated and I agree with notes by Nurse Practitioner. Corena Pilgrim, MD

## 2013-11-14 NOTE — [facility] Suicide Risk Assessment (Signed)
Suicide Risk Assessment  Discharge Assessment     Demographic Factors:  Male  Total Time spent with patient: 20 minutes  Psychiatric Specialty Exam:     Blood pressure 116/71, pulse 71, temperature 98.1 F (36.7 C), temperature source Oral, resp. rate 16, SpO2 98.00%.There is no weight on file to calculate BMI.  General Appearance: Casual  Eye Contact::  Good  Speech:  Normal Rate  Volume:  Normal  Mood:  Irritable  Affect:  Congruent  Thought Process:  Coherent  Orientation:  Full (Time, Place, and Person)  Thought Content:  WDL  Suicidal Thoughts:  No  Homicidal Thoughts:  No  Memory:  Immediate;   Good Recent;   Good Remote;   Good  Judgement:  Fair  Insight:  Fair  Psychomotor Activity:  Normal  Concentration:  Good  Recall:  Good  Fund of Knowledge:Good  Language: Good  Akathisia:  No  Handed:  Right  AIMS (if indicated):     Assets:  Health and safety inspector Housing Leisure Time Physical Health Resilience Social Support Transportation  Sleep:      Musculoskeletal: Strength & Muscle Tone: within normal limits Gait & Station: normal Patient leans: N/A  Mental Status Per Nursing Assessment::   On Admission:   cocaine abuse/dependency  Current Mental Status by Physician: NA  Loss Factors: NA  Historical Factors: NA  Risk Reduction Factors:   Sense of responsibility to family, Employed, Living with another person, especially a relative and Positive social support  Continued Clinical Symptoms:  Irritability  Cognitive Features That Contribute To Risk:  None  Suicide Risk:  Minimal: No identifiable suicidal ideation.  Patients presenting with no risk factors but with morbid ruminations; may be classified as minimal risk based on the severity of the depressive symptoms  Discharge Diagnoses:   AXIS I:  Substance Abuse and Substance Induced Mood Disorder AXIS II:  Deferred AXIS III:   Past Medical History  Diagnosis Date  . Diabetes  mellitus without complication   . Hypercholesteremia   . Manic depression   . Hypertension    AXIS IV:  other psychosocial or environmental problems, problems related to social environment and problems with primary support group AXIS V:  61-70 mild symptoms  Plan Of Care/Follow-up recommendations:  Activity:  as tolerated Diet:  low-sodium heart healhty diet  Is patient on multiple antipsychotic therapies at discharge:  No   Has Patient had three or more failed trials of antipsychotic monotherapy by history:  No  Recommended Plan for Multiple Antipsychotic Therapies: NA    Aliyah Abeyta, PMH-NP 11/14/2013, 11:44 AM

## 2013-11-17 LAB — CBG MONITORING, ED: GLUCOSE-CAPILLARY: 162 mg/dL — AB (ref 70–99)

## 2013-12-16 ENCOUNTER — Encounter (HOSPITAL_COMMUNITY): Payer: Self-pay | Admitting: *Deleted

## 2013-12-16 ENCOUNTER — Encounter (HOSPITAL_COMMUNITY): Payer: Self-pay | Admitting: Emergency Medicine

## 2013-12-16 ENCOUNTER — Emergency Department (HOSPITAL_COMMUNITY)
Admission: EM | Admit: 2013-12-16 | Discharge: 2013-12-16 | Disposition: A | Discharge status: Other Acute Inpatient Hospital | Payer: Medicaid Other | Attending: Emergency Medicine | Admitting: Emergency Medicine

## 2013-12-16 ENCOUNTER — Observation Stay (HOSPITAL_COMMUNITY)
Admission: AD | Admit: 2013-12-16 | Discharge: 2013-12-17 | Disposition: A | Discharge status: Home / Self Care | Payer: Federal, State, Local not specified - Other | Source: Intra-hospital | Attending: Family | Admitting: Family

## 2013-12-16 DIAGNOSIS — E119 Type 2 diabetes mellitus without complications: Secondary | ICD-10-CM | POA: Insufficient documentation

## 2013-12-16 DIAGNOSIS — F1994 Other psychoactive substance use, unspecified with psychoactive substance-induced mood disorder: Secondary | ICD-10-CM

## 2013-12-16 DIAGNOSIS — F1094 Alcohol use, unspecified with alcohol-induced mood disorder: Secondary | ICD-10-CM | POA: Insufficient documentation

## 2013-12-16 DIAGNOSIS — Z79899 Other long term (current) drug therapy: Secondary | ICD-10-CM | POA: Insufficient documentation

## 2013-12-16 DIAGNOSIS — F1020 Alcohol dependence, uncomplicated: Secondary | ICD-10-CM

## 2013-12-16 DIAGNOSIS — I10 Essential (primary) hypertension: Secondary | ICD-10-CM | POA: Insufficient documentation

## 2013-12-16 DIAGNOSIS — F1010 Alcohol abuse, uncomplicated: Secondary | ICD-10-CM | POA: Diagnosis present

## 2013-12-16 DIAGNOSIS — Z794 Long term (current) use of insulin: Secondary | ICD-10-CM | POA: Insufficient documentation

## 2013-12-16 DIAGNOSIS — F1420 Cocaine dependence, uncomplicated: Secondary | ICD-10-CM

## 2013-12-16 DIAGNOSIS — R45851 Suicidal ideations: Secondary | ICD-10-CM

## 2013-12-16 DIAGNOSIS — F10229 Alcohol dependence with intoxication, unspecified: Secondary | ICD-10-CM | POA: Insufficient documentation

## 2013-12-16 DIAGNOSIS — F339 Major depressive disorder, recurrent, unspecified: Secondary | ICD-10-CM | POA: Insufficient documentation

## 2013-12-16 DIAGNOSIS — F1494 Cocaine use, unspecified with cocaine-induced mood disorder: Principal | ICD-10-CM | POA: Insufficient documentation

## 2013-12-16 DIAGNOSIS — Z720 Tobacco use: Secondary | ICD-10-CM | POA: Insufficient documentation

## 2013-12-16 DIAGNOSIS — F17210 Nicotine dependence, cigarettes, uncomplicated: Secondary | ICD-10-CM | POA: Insufficient documentation

## 2013-12-16 DIAGNOSIS — F319 Bipolar disorder, unspecified: Secondary | ICD-10-CM | POA: Insufficient documentation

## 2013-12-16 LAB — GLUCOSE, CAPILLARY
GLUCOSE-CAPILLARY: 144 mg/dL — AB (ref 70–99)
GLUCOSE-CAPILLARY: 112 mg/dL — AB (ref 70–99)

## 2013-12-16 LAB — COMPREHENSIVE METABOLIC PANEL
ALK PHOS: 68 U/L (ref 39–117)
ALT: 44 U/L (ref 0–53)
AST: 57 U/L — ABNORMAL HIGH (ref 0–37)
Albumin: 3.6 g/dL (ref 3.5–5.2)
Anion gap: 11 (ref 5–15)
BUN: 6 mg/dL (ref 6–23)
CO2: 25 mEq/L (ref 19–32)
Calcium: 8.9 mg/dL (ref 8.4–10.5)
Chloride: 103 mEq/L (ref 96–112)
Creatinine, Ser: 0.8 mg/dL (ref 0.50–1.35)
GFR calc Af Amer: >90 mL/min (ref >90)
GFR calc non Af Amer: >90 mL/min (ref >90)
Glucose, Bld: 166 mg/dL — ABNORMAL HIGH (ref 70–99)
Potassium: 4.2 mEq/L (ref 3.7–5.3)
Sodium: 139 mEq/L (ref 137–147)
Total Bilirubin: 0.8 mg/dL (ref 0.3–1.2)
Total Protein: 7.8 g/dL (ref 6.0–8.3)

## 2013-12-16 LAB — CBC
HCT: 44.3 % (ref 39.0–52.0)
Hemoglobin: 15.3 g/dL (ref 13.0–17.0)
MCH: 31.5 pg (ref 26.0–34.0)
MCHC: 34.5 g/dL (ref 30.0–36.0)
MCV: 91.2 fL (ref 78.0–100.0)
PLATELETS: 209 10*3/uL (ref 150–400)
RBC: 4.86 MIL/uL (ref 4.22–5.81)
RDW: 13.4 % (ref 11.5–15.5)
WBC: 9 10*3/uL (ref 4.0–10.5)

## 2013-12-16 LAB — RAPID URINE DRUG SCREEN, HOSP PERFORMED
AMPHETAMINES: NOT DETECTED
Barbiturates: NOT DETECTED
Benzodiazepines: NOT DETECTED
COCAINE: POSITIVE — AB
Opiates: NOT DETECTED
Tetrahydrocannabinol: NOT DETECTED

## 2013-12-16 LAB — CBG MONITORING, ED
GLUCOSE-CAPILLARY: 148 mg/dL — AB (ref 70–99)
Glucose-Capillary: 178 mg/dL — ABNORMAL HIGH (ref 70–99)

## 2013-12-16 LAB — SALICYLATE LEVEL: Salicylate Lvl: <2 mg/dL — ABNORMAL LOW (ref 2.8–20.0)

## 2013-12-16 LAB — ETHANOL: Alcohol, Ethyl (B): <11 mg/dL (ref 0–11)

## 2013-12-16 LAB — ACETAMINOPHEN LEVEL: Acetaminophen (Tylenol), Serum: <15 ug/mL (ref 10–30)

## 2013-12-16 MED ORDER — LISINOPRIL 10 MG PO TABS
5.0000 mg | ORAL_TABLET | Freq: Every day | ORAL | Status: DC
  Administered 2013-12-16: 5 mg via ORAL
  Filled 2013-12-16: qty 1

## 2013-12-16 MED ORDER — INSULIN ASPART 100 UNIT/ML ~~LOC~~ SOLN
0.0000 [IU] | Freq: Three times a day (TID) | SUBCUTANEOUS | Status: DC
  Administered 2013-12-16 – 2013-12-17 (×2): 2 [IU] via SUBCUTANEOUS

## 2013-12-16 MED ORDER — INSULIN GLARGINE 100 UNIT/ML ~~LOC~~ SOLN
52.0000 [IU] | Freq: Every day | SUBCUTANEOUS | Status: DC
  Administered 2013-12-16: 52 [IU] via SUBCUTANEOUS

## 2013-12-16 MED ORDER — INSULIN GLARGINE 100 UNIT/ML ~~LOC~~ SOLN
52.0000 [IU] | Freq: Every day | SUBCUTANEOUS | Status: DC
  Filled 2013-12-16: qty 0.52

## 2013-12-16 MED ORDER — DEXTROSE-NACL 5-0.45 % IV SOLN: INTRAVENOUS | Status: DC

## 2013-12-16 MED ORDER — GLIMEPIRIDE 2 MG PO TABS
2.0000 mg | ORAL_TABLET | Freq: Every day | ORAL | Status: DC
  Administered 2013-12-16: 2 mg via ORAL
  Filled 2013-12-16 (×2): qty 1

## 2013-12-16 MED ORDER — METFORMIN HCL 500 MG PO TABS
1000.0000 mg | ORAL_TABLET | Freq: Two times a day (BID) | ORAL | Status: DC
  Administered 2013-12-16 – 2013-12-17 (×2): 1000 mg via ORAL
  Filled 2013-12-16 (×6): qty 2

## 2013-12-16 MED ORDER — INSULIN ASPART 100 UNIT/ML ~~LOC~~ SOLN
0.0000 [IU] | Freq: Three times a day (TID) | SUBCUTANEOUS | Status: DC
  Administered 2013-12-16: 3 [IU] via SUBCUTANEOUS
  Administered 2013-12-16: 2 [IU] via SUBCUTANEOUS
  Filled 2013-12-16 (×2): qty 1

## 2013-12-16 MED ORDER — ACETAMINOPHEN 325 MG PO TABS: 650.0000 mg | ORAL_TABLET | Freq: Four times a day (QID) | ORAL | Status: DC | PRN

## 2013-12-16 MED ORDER — GABAPENTIN 100 MG PO CAPS
100.0000 mg | ORAL_CAPSULE | Freq: Three times a day (TID) | ORAL | Status: DC
  Administered 2013-12-16: 100 mg via ORAL
  Filled 2013-12-16 (×3): qty 1

## 2013-12-16 MED ORDER — GABAPENTIN 100 MG PO CAPS
100.0000 mg | ORAL_CAPSULE | Freq: Three times a day (TID) | ORAL | Status: DC
  Administered 2013-12-16 – 2013-12-17 (×2): 100 mg via ORAL
  Filled 2013-12-16 (×7): qty 1

## 2013-12-16 MED ORDER — TRAZODONE HCL 50 MG PO TABS: 50.0000 mg | ORAL_TABLET | Freq: Every day | ORAL | Status: DC

## 2013-12-16 MED ORDER — TRAZODONE HCL 50 MG PO TABS
50.0000 mg | ORAL_TABLET | Freq: Every evening | ORAL | Status: DC | PRN
  Administered 2013-12-16: 50 mg via ORAL
  Filled 2013-12-16: qty 1

## 2013-12-16 MED ORDER — INSULIN ASPART 100 UNIT/ML ~~LOC~~ SOLN: 0.0000 [IU] | Freq: Every day | SUBCUTANEOUS | Status: DC

## 2013-12-16 MED ORDER — HYDROXYZINE HCL 25 MG PO TABS
25.0000 mg | ORAL_TABLET | Freq: Four times a day (QID) | ORAL | Status: DC | PRN
  Administered 2013-12-16: 25 mg via ORAL
  Filled 2013-12-16: qty 1

## 2013-12-16 MED ORDER — ALUM & MAG HYDROXIDE-SIMETH 200-200-20 MG/5ML PO SUSP: 30.0000 mL | ORAL | Status: DC | PRN

## 2013-12-16 MED ORDER — NICOTINE 21 MG/24HR TD PT24
21.0000 mg | MEDICATED_PATCH | Freq: Every day | TRANSDERMAL | Status: DC
  Administered 2013-12-16 – 2013-12-17 (×2): 21 mg via TRANSDERMAL
  Filled 2013-12-16 (×5): qty 1

## 2013-12-16 MED ORDER — MAGNESIUM HYDROXIDE 400 MG/5ML PO SUSP: 30.0000 mL | Freq: Every day | ORAL | Status: DC | PRN

## 2013-12-16 MED ORDER — GLIMEPIRIDE 2 MG PO TABS
2.0000 mg | ORAL_TABLET | Freq: Every day | ORAL | Status: DC
  Administered 2013-12-17: 2 mg via ORAL
  Filled 2013-12-16 (×2): qty 1

## 2013-12-16 MED ORDER — LISINOPRIL 5 MG PO TABS
5.0000 mg | ORAL_TABLET | Freq: Every day | ORAL | Status: DC
  Administered 2013-12-16 – 2013-12-17 (×2): 5 mg via ORAL
  Filled 2013-12-16 (×5): qty 1

## 2013-12-16 NOTE — BH Assessment (Deleted)
[facility] OBS UNIT H&P   Subjective: Pt seen and chart reviewed. Pt reports that he spent the night in the OBS unit as a means to detox and also process his stressful situation with his wife and their conflict over homes and vehicles in the midst of a breakup and possible divorce. Pt reports that he had situational suicidal thoughts last night upon entry to the OBS Unit and that these have now resolved. Pt denies SI, HI, and AVH, contracts for safety. Pt reports that he would like to go to inpatient rehab. However, pt became irate with nursing staff stating that he was upset he did not meet detox criteria for [facility]. Initially, pt reported that [facility] due to his car being in the parking lot, then he reported that he wanted to stay, then later wanted to leave immediately. Pt was discharged with outpatient [facility].   HPI: Stephen Guzman is an 48 y.o. male. Pt presents to [facility] with C/O increased Depression and Substance Addiction. Pt's presentse agitated and angry. Pt states, " I need help". Pt endorses situational SI with thoughts of consuming as much etoh and crack as possible that his heart will stop due to his stressors. Pt reports binge etoh and crack/cocaine use approximately 3-4 times per week.  Pt reports that he last drank etoh and smoke crack on 12-16-13 between 130-2:00am.  Pt reports that he smoked $150 worth of crack and drank 3 bottles of wine and a couple 40 oz beers. Patient reports that he will be evicted from his house this month as he will not present to court for eviction procedings on designated court day because he has no job or money to pay his rent. Pt reports that he and his wife separated earlier this month and she took both vehicles and all Television's in the home leaving him with pretty much nothing. Pt reports that he had Homicidal ideations towards his wife weeks ago. Pt denies current HI towards his wife stating he is angry with her and does  not know what he will do if he see's her. Pt reports recently getting one of his vehicles back from his wife.  Pt reports a history of being diagnosed with Bipolar Disorder Diagnosis and Manic Depression while receiving treatment in [place] years ago when he lived there. Pt reports that he has not slept in 3 days and reports increased nightmares. Pt reports non-compliant with his medication for the past 20 days. Pt is unable to reliably contract for safety.  Consulted with Clementeen HoofSean Taylor and Renata Capriceonrad Bryten Maher-FNP whom is recommending that patient be admitted to observation unit for treatment. Pt assigned to observation bed #4. Attending Physician assigned is Dr. Nelly RoutArchana Kumar.  EDP Dr.Nanavati informed of pt's acceptance to obs unit. ED Nurse Drinda Buttsnnette has been notified and agreeable to have patient sign obs unit voluntary consent form and will coordinate transport via [facility].   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 Diabetes mellitus without complication   . Hypercholesteremia   . Manic depression   . Hypertension    Axis IV: economic problems, housing problems, other psychosocial or environmental problems and problems related to social environment Axis V: 51-60 moderate symptoms  Past Medical History:  Past Medical History  Diagnosis Date  . Diabetes mellitus without complication   . Hypercholesteremia   .  Manic depression   . Hypertension     Past Surgical History  Procedure Laterality Date  . Total hip arthroplasty      Family History: History reviewed. No pertinent family history.  Social History:  reports that he has been smoking Cigarettes.  He has been smoking about 0.50 packs per day. He does not have any smokeless tobacco history on file. He reports that he drinks about 14.4 ounces of alcohol per week. He reports  that he uses illicit drugs (Cocaine).  Additional Social History:  Alcohol / Drug Use Pain Medications: Not abusing Prescriptions: not abusing Over the Counter: not abusing History of alcohol / drug use?: Yes Negative Consequences of Use: Work / School;Personal relationships;Financial Withdrawal Symptoms: Sweats Substance #1 Name of Substance 1: alcohol 1 - Age of First Use: 5 1 - Amount (size/oz): 3 bottles of wine 1 - Frequency: 3-4 times a week 1 - Duration: ongoing 1 - Last Use / Amount: 2 am today Substance #2 Name of Substance 2: cocaine 2 - Age of First Use: unknkown 2 - Amount (size/oz): 150 dollars a day 2 - Frequency: 3-4 times a week 2 - Duration: ongoing 2 - Last Use / Amount: this am  CIWA: CIWA-Ar BP: 126/105 mmHg Pulse Rate: 77 Nausea and Vomiting: no nausea and no vomiting Tactile Disturbances: none Tremor: no tremor Auditory Disturbances: not present Paroxysmal Sweats: barely perceptible sweating, palms moist Visual Disturbances: not present Anxiety: mildly anxious Headache, Fullness in Head: none present Agitation: normal activity Orientation and Clouding of Sensorium: oriented and can do serial additions CIWA-Ar Total: 2 COWS:    PATIENT STRENGTHS: (choose at least two) Ability for insight Average or above average intelligence  Allergies: No Known Allergies  Home Medications:  Medications Prior to Admission  Medication Sig Dispense Refill  . gabapentin (NEURONTIN) 100 MG capsule Take 1 capsule (100 mg total) by mouth 3 (three) times daily. For substance withdrawal syndrome  90 capsule  0  . glimepiride (AMARYL) 2 MG tablet Take 1 tablet (2 mg total) by mouth daily with breakfast. For diabetes management  30 tablet  0  . insulin aspart (NOVOLOG) 100 UNIT/ML injection Sliding scale as directed by medical physician. Per Patient uses sliding scale at home  10 mL  11  . insulin glargine (LANTUS) 100 UNIT/ML injection Inject 0.52 mLs (52 Units total)  into the skin at bedtime.  10 mL  11  . lisinopril (PRINIVIL,ZESTRIL) 5 MG tablet Take 1 tablet (5 mg total) by mouth daily. For hypertension  30 tablet  0  . metFORMIN (GLUCOPHAGE) 500 MG tablet Take 2 tablets (1,000 mg total) by mouth 2 (two) times daily with a meal. For diabetes  120 tablet  0  . traZODone (DESYREL) 50 MG tablet Take 1 tablet (50 mg total) by mouth at bedtime as needed for sleep.  30 tablet  0    Disposition:  -Discharge home with outpatient resources for followup for substance abuse and depression.   Beau FannyWithrow, Yeng Perz C, FNP-BC 12/17/2013 :59 PM

## 2013-12-16 NOTE — Plan of Care (Signed)
[facility] Observation Crisis Plan  Reason for Crisis Plan:  Substance Abuse   Plan of Care:  Referral for Substance Abuse  Family Support:    None (estranged from wife)  Current Living Environment:  Living Arrangements: Spouse/significant other; Pt was living with wife, but she is leaving him and he anticipates immanent eviction.  Insurance:  [facility] Account   Name Acct ID Class Status Primary Coverage   Janene HarveyXxxcanady, Haiden [id number] Emergency Discharged/Not Billed None        Guarantor Account (for Hospital Account [id number])   Name Relation to Pt Service Area Active? Acct Type   Catarina Hartshornanady, Tyleek Self [facility] Yes Personal/Family   Address Phone       [address] [place], [place] [address] [phone])          Coverage Information (for Hospital Account [id number])   Not on file      Legal Guardian:   Self  Primary Care Provider:  No PCP Per Patient; [facility]  Current Outpatient Providers:  None  Psychiatrist:  Name of Psychiatrist: No Current Provider  Counselor/Therapist:  Name of Therapist: No Current Provider  Compliant with Medications:  No; pt reports that his wife has handled administering his medications.  He has neglected them since she left him.  Additional Information: Pt wants placement in a residential substance abuse rehabilitation facility.  He has signed Consent to Release Information to the following facilities and programs:  *[facility] *[facility] *[facility] *[facility] *[facility]  After preliminary staffing with Claudette Headonrad Withrow, NP I have pursued several placement options.  The following providers have been ruled out: [facility] (only provides referrals); [facility] (a safe house where people with addiction problems can stay while seeking placement); [facility] (per Genelle BalBrett, they are not able to treat insulin dependent diabetics).  Pt also indicates that he does not want to be  ref[facility] to [facility] due to past experience there.  At 17:00 I called [facility].  They report that I will need to call in the morning when staff that handle residential admissions are available.  At 17:05 I called [facility] and spoke to [place].  She reports that the wait list for residential rehab at their facility is 3 - 4 month long.  Pt has also been given information regarding [facility], and he is considering whether their program would be suitable for his needs at this time.  Further placement efforts will be deferred until tomorrow (12/17/2013).    Doylene Canninghomas Rudolph Dobler, MA Triage Specialist Raphael GibneyHughes, Lavetta Geier Patrick 10/20/20155:17 PM

## 2013-12-16 NOTE — ED Provider Notes (Signed)
CSN: [id number]     Arrival date & time 12/16/13  0423 History   First MD Initiated Contact with Patient 12/16/13 [phone]     Chief Complaint  Patient presents with  . Substance abuse      (Consider location/radiation/quality/duration/timing/severity/associated sxs/prior Treatment) HPI Tullio Patsi SearsCanady is a 48 y.o. male with past medical history of manic depression, hypertension, through pleura abuse coming in with request for detoxification. Patient uses cocaine and alcohol. He states he started cocaine today. He denies IV use. He drinks alcohol today as well, 3 bottles of wine. He states he normally drinks 10 shots per day and 2 or 3-40 ounce beers per day. He states he's never had withdrawal symptoms or seizures. However he states when he doesn't drink he begins to cramp and sweat. He says his wife states he's placed on the past. Is currently denying any medical complaints such as fever or recent infections chest pain shortness of breath pain nausea vomiting or diarrhea. He does have difficulty urinating due to to his known prostate disease. Patient also states he wants to hurt everybody including herself because he is depressed. His wife left him into both of his cars. Other complaints.  10 Systems reviewed and are negative for acute change except as noted in the HPI.     Past Medical History  Diagnosis Date  . Diabetes mellitus without complication   . Hypercholesteremia   . Manic depression   . Hypertension    Past Surgical History  Procedure Laterality Date  . Total hip arthroplasty     History reviewed. No pertinent family history. History  Substance Use Topics  . Smoking status: Current Every Day Smoker -- 0.50 packs/day    Types: Cigarettes  . Smokeless tobacco: Not on file  . Alcohol Use: 14.4 oz/week    24 Cans of beer per week     Comment: daily     Review of Systems    Allergies  Review of patient's allergies indicates no known allergies.  Home Medications    Prior to Admission medications   Medication Sig Start Date End Date Taking? Authorizing Provider  gabapentin (NEURONTIN) 100 MG capsule Take 1 capsule (100 mg total) by mouth 3 (three) times daily. For substance withdrawal syndrome 04/28/13   Sanjuana KavaAgnes I Nwoko, NP  glimepiride (AMARYL) 2 MG tablet Take 1 tablet (2 mg total) by mouth daily with breakfast. For diabetes management 04/28/13   Sanjuana KavaAgnes I Nwoko, NP  insulin aspart (NOVOLOG) 100 UNIT/ML injection Sliding scale as directed by medical physician. Per Patient uses sliding scale at home 04/28/13   Rachael FeeIrving A Lugo, MD  insulin glargine (LANTUS) 100 UNIT/ML injection Inject 0.52 mLs (52 Units total) into the skin at bedtime. 04/27/13   Truman Haywardakia S Starkes, FNP  lisinopril (PRINIVIL,ZESTRIL) 5 MG tablet Take 1 tablet (5 mg total) by mouth daily. For hypertension 04/23/13   Sanjuana KavaAgnes I Nwoko, NP  metFORMIN (GLUCOPHAGE) 500 MG tablet Take 2 tablets (1,000 mg total) by mouth 2 (two) times daily with a meal. For diabetes 04/28/13   Rachael FeeIrving A Lugo, MD  traZODone (DESYREL) 50 MG tablet Take 1 tablet (50 mg total) by mouth at bedtime as needed for sleep. 04/23/13   Sanjuana KavaAgnes I Nwoko, NP   BP 169/115  Pulse 102  Temp(Src) 98.5 F (36.9 C) (Oral)  Resp 18  Ht 5\' 11"  (1.803 m)  Wt 239 lb (108.41 kg)  BMI 33.35 kg/m2  SpO2 98% Physical Exam  Nursing note and vitals reviewed. Constitutional:  He is oriented to person, place, and time. Vital signs are normal. He appears well-developed and well-nourished.  Non-toxic appearance. He does not appear ill. No distress.  HENT:  Head: Normocephalic and atraumatic.  Nose: Nose normal.  Mouth/Throat: Oropharynx is clear and moist. No oropharyngeal exudate.  Eyes: Conjunctivae and EOM are normal. Pupils are equal, round, and reactive to light. No scleral icterus.  Neck: Normal range of motion. Neck supple. No tracheal deviation, no edema, no erythema and normal range of motion present. No mass and no thyromegaly present.  Cardiovascular:  Normal rate, regular rhythm, S1 normal, S2 normal, normal heart sounds, intact distal pulses and normal pulses.  Exam reveals no gallop and no friction rub.   No murmur heard. Pulses:      Radial pulses are 2+ on the right side, and 2+ on the left side.       Dorsalis pedis pulses are 2+ on the right side, and 2+ on the left side.  Pulmonary/Chest: Effort normal and breath sounds normal. No respiratory distress. He has no wheezes. He has no rhonchi. He has no rales.  Abdominal: Soft. Normal appearance and bowel sounds are normal. He exhibits no distension, no ascites and no mass. There is no hepatosplenomegaly. There is no tenderness. There is no rebound, no guarding and no CVA tenderness.  Musculoskeletal: Normal range of motion. He exhibits no edema and no tenderness.  Lymphadenopathy:    He has no cervical adenopathy.  Neurological: He is alert and oriented to person, place, and time. He has normal strength. No cranial nerve deficit or sensory deficit. GCS eye subscore is 4. GCS verbal subscore is 5. GCS motor subscore is 6.  Skin: Skin is warm, dry and intact. No petechiae and no rash noted. He is not diaphoretic. No erythema. No pallor.  Psychiatric: Thought content normal.    ED Course  Procedures (including critical care time) Labs Review Labs Reviewed  ACETAMINOPHEN LEVEL  CBC  COMPREHENSIVE METABOLIC PANEL  ETHANOL  SALICYLATE LEVEL  URINE RAPID DRUG SCREEN (HOSP PERFORMED)    Imaging Review No results found.   EKG Interpretation None      MDM   Final diagnoses:  None    Patient presents to emergency department for detoxification. He has no medical complaints he'll be cleared for psychiatric evaluation. He did try to call the crisis hotline and get help outside of [place]. He thought this would be best for him, they gave him contacts in [place] in [place] but the patient did not have a car to get to the cities because his wife took his 2 cars.    Tomasita CrumbleAdeleke  Skylin Kennerson, MD 12/16/13 [phone]

## 2013-12-16 NOTE — BH Assessment (Signed)
Consulted with Clementeen HoofSean Taylor and Renata Capriceonrad Withrow-FNP whom is recommending that patient be admitted to observation unit for treatment. Pt assigned to observation bed #4. Attending Physician assigned is Dr. Nelly RoutArchana Kumar.  EDP Dr.Nanavati informed of pt's acceptance to obs unit. ED Nurse Drinda Buttsnnette has been notified and agreeable to have patient sign obs unit voluntary consent form and will coordinate transport via [facility].    Glorious PeachNajah Akanksha Bellmore, MS, LCASA Assessment Counselor

## 2013-12-16 NOTE — ED Notes (Signed)
TTS COMPLETED 

## 2013-12-16 NOTE — ED Notes (Signed)
Stephen Guzman has arrived to transport pt and his belongings to [facility]

## 2013-12-16 NOTE — BH Assessment (Signed)
Tele Assessment Note   Catarina HartshornDeno Heldt is an 48 y.o. male. Pt presents to [facility] with C/O increased Depression and Substance Addiction. Pt's presentse agitated and angry. Pt states, " I need help". Pt endorses situational SI with thoughts of consuming as much etoh and crack as possible that his heart will stop due to his stressors. Pt reports binge etoh and crack/cocaine use approximately 3-4 times per week.  Pt reports that he last drank etoh and smoke crack on 12-16-13 between 130-2:00am.  Pt reports that he smoked $150 worth of crack and drank 3 bottles of wine and a couple 40 oz beers. Patient reports that he will be evicted from his house this month as he will not present to court for eviction procedings on designated court day because he has no job or money to pay his rent. Pt reports that he and his wife separated earlier this month and she took both vehicles and all Television's in the home leaving him with pretty much nothing. Pt reports that he had Homicidal ideations towards his wife weeks ago. Pt denies current HI towards his wife stating he is angry with her and does not know what he will do if he see's her. Pt reports recently getting one of his vehicles back from his wife.  Pt reports a history of being diagnosed with Bipolar Disorder Diagnosis and Manic Depression while receiving treatment in [place] years ago when he lived there. Pt reports that he has not slept in 3 days and reports increased nightmares. Pt reports non-compliant with his medication for the past 20 days. Pt is unable to reliably contract for safety.  Consulted with Clementeen HoofSean Taylor and Renata Capriceonrad Withrow-FNP whom is recommending that patient be admitted to observation unit for treatment. Pt assigned to observation bed #4. Attending Physician assigned is Dr. Nelly RoutArchana Kumar.  EDP Dr.Nanavati informed of pt's acceptance to obs unit. ED Nurse Drinda Buttsnnette has been notified and agreeable to have patient sign obs unit voluntary consent form and will  coordinate transport via [facility].  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 Diabetes mellitus without complication   . Hypercholesteremia   . Manic depression   . Hypertension    Axis IV: economic problems, housing problems, other psychosocial or environmental problems and problems related to social environment Axis V: 31-40 impairment in reality testing  Past Medical History:  Past Medical History  Diagnosis Date  . Diabetes mellitus without complication   . Hypercholesteremia   . Manic depression   . Hypertension     Past Surgical History  Procedure Laterality Date  . Total hip arthroplasty      Family History: History reviewed. No pertinent family history.  Social History:  reports that he has been smoking Cigarettes.  He has been smoking about 0.50 packs per day. He does not have any smokeless tobacco history on file. He reports that he drinks about 14.4 ounces of alcohol per week. He reports that he uses illicit drugs (Cocaine).  Additional Social History:  Alcohol / Drug Use History of alcohol / drug use?: Yes Negative Consequences of Use: Financial;Personal relationships;Work / Mining engineerchool Substance #1 Name of Substance 1:  (Etoh- Wine and Beer) 1 - Age of First Use:  (5) 1 - Amount (size/oz):  ("a lot" amount usage varies depending on mood per pt) 1 - Frequency:  (3-4 days per week) 1 - Duration:  (  on-going use for years) 1 - Last Use / Amount:  (12/16/13- 3 bottles of wine and a couple 40 oz beers) Substance #2 Name of Substance 2:  (Crack/Cocaine) 2 - Age of First Use:  ([age]) 2 - Amount (size/oz):  ("a lot" amount usage varies depending on mood per pt) 2 - Frequency:  (3-4x per week) 2 - Duration:  (on-going use for years) 2 - Last Use / Amount:  (12/16/13-$150 worth)  CIWA: CIWA-Ar BP: 148/106  mmHg Pulse Rate: 71 Nausea and Vomiting: no nausea and no vomiting Tactile Disturbances: none Tremor: no tremor Auditory Disturbances: not present Paroxysmal Sweats: no sweat visible Visual Disturbances: not present Anxiety: no anxiety, at ease Headache, Fullness in Head: none present Agitation: normal activity Orientation and Clouding of Sensorium: oriented and can do serial additions CIWA-Ar Total: 0 COWS:    PATIENT STRENGTHS: (choose at least two) Ability for insight Average or above average intelligence  Allergies: No Known Allergies  Home Medications:  (Not in a hospital admission)  OB/GYN Status:  No LMP for male patient.  General Assessment Data [facility] Is this a Tele or Face-to-Face Assessment?: Tele Assessment Is this an Initial Assessment or a Re-assessment for this encounter?: Initial Assessment Living Arrangements: Spouse/significant other Can pt return to current living arrangement?: Yes (pt reports he will be evicted from his home this month) Admission Status: Voluntary Is patient capable of signing voluntary admission?: Yes Transfer from: Acute Hospital Referral Source: [facility]     [facility] Living Arrangements: Spouse/significant other Name of Psychiatrist: No Current Provider Name of Therapist: No Current Provider     Risk to self with the past 6 months Suicidal Ideation: Yes-Currently Present Suicidal Intent: No Is patient at risk for suicide?: Yes Suicidal Plan?: Yes-Currently Present Specify Current Suicidal Plan: drink and use as much crack and etoh as possible in hopes that his heart will stop Access to Means: Yes Specify Access to Suicidal Means: access to etoh and Crack What has been your use of drugs/alcohol within the last 12 months?: etoh and crack/cocaine Previous Attempts/Gestures: No How many times?: 0 Other Self Harm Risks: none reported Triggers for Past Attempts: Other personal  contacts Intentional Self Injurious Behavior: None Family Suicide History: No Recent stressful life event(s): Conflict (Comment);Financial Problems;Recent negative physical changes;Turmoil (Comment) Persecutory voices/beliefs?: No Depression: Yes Depression Symptoms: Insomnia;Feeling worthless/self pity;Feeling angry/irritable Substance abuse history and/or treatment for substance abuse?: Yes Suicide prevention information given to non-admitted patients: Not applicable  Risk to Others within the past 6 months Homicidal Ideation: No Thoughts of Harm to Others: No Current Homicidal Intent: No Current Homicidal Plan: No Access to Homicidal Means: No Identified Victim: na History of harm to others?: No Assessment of Violence: None Noted Violent Behavior Description: None Noted Does patient have access to weapons?: No Criminal Charges Pending?: No Does patient have a court date: No  Psychosis Hallucinations: None noted Delusions: None noted  Mental Status Report Appear/Hygiene: In scrubs Eye Contact: Fair Motor Activity: Freedom of movement Speech: Logical/coherent Level of Consciousness: Alert Mood: Depressed;Anxious;Angry Affect: Angry;Anxious;Depressed Anxiety Level: Minimal Thought Processes: Coherent;Relevant Judgement: Impaired Orientation: Person;Place;Time;Situation Obsessive Compulsive Thoughts/Behaviors: None  Cognitive Functioning Concentration: Normal Memory: Recent Intact;Remote Intact IQ: Average Insight: Fair Impulse Control: Fair Appetite: Poor Weight Loss: 0 Weight Gain: 0 Sleep: Decreased Total Hours of Sleep: 2 Vegetative Symptoms: None  ADLScreening [facility] Assessment Services) Patient's cognitive ability adequate to safely complete daily activities?: Yes Patient able to express need for assistance  with ADLs?: Yes Independently performs ADLs?: Yes (appropriate for developmental age)  Prior Inpatient Therapy Prior Inpatient Therapy: No Prior  Therapy Dates: na Prior Therapy Facilty/Provider(s): na Reason for Treatment: na  Prior Outpatient Therapy Prior Outpatient Therapy: Yes Prior Therapy Dates: 2011-2012 Prior Therapy Facilty/Provider(s): [facility] [place] Reason for Treatment: SA/Depression  ADL Screening (condition at time of admission) Patient's cognitive ability adequate to safely complete daily activities?: Yes Is the patient deaf or have difficulty hearing?: No Does the patient have difficulty seeing, even when wearing glasses/contacts?: No Does the patient have difficulty concentrating, remembering, or making decisions?: No Patient able to express need for assistance with ADLs?: Yes Does the patient have difficulty dressing or bathing?: No Independently performs ADLs?: Yes (appropriate for developmental age) Does the patient have difficulty walking or climbing stairs?: No Weakness of Legs: None Weakness of Arms/Hands: None  Home Assistive Devices/Equipment Home Assistive Devices/Equipment: None    Abuse/Neglect Assessment (Assessment to be complete while patient is alone) Physical Abuse: Denies Verbal Abuse: Denies Sexual Abuse: Denies Exploitation of patient/patient's resources: Denies Self-Neglect: Denies Values / Beliefs Cultural Requests During Hospitalization: None Spiritual Requests During Hospitalization: None   Advance Directives (For Healthcare) Does patient have an advance directive?: No Would patient like information on creating an advanced directive?: No - patient declined information    Additional Information 1:1 In Past 12 Months?: No CIRT Risk: No Elopement Risk: No Does patient have medical clearance?: Yes     Disposition:  Disposition Initial Assessment Completed for this Encounter: Yes Disposition of Patient: Inpatient treatment program (Per Renata Caprice pt accepted to obs bed #4) Type of inpatient treatment program: Adult  Gerline Legacy, MS, LCASA Assessment Counselor  12/16/2013 2:10 PM

## 2013-12-16 NOTE — Progress Notes (Signed)
[facility] INPATIENT:  Family/Significant Other Suicide Prevention Education  Suicide Prevention Education:  Patient Refusal for Family/Significant Other Suicide Prevention Education: The patient Stephen Guzman has refused to provide written consent for family/significant other to be provided Family/Significant Other Suicide Prevention Education during admission and/or prior to discharge.  Physician notified.  Wynona LunaBeck, Daielle Melcher K 12/16/2013, 3:22 PM

## 2013-12-16 NOTE — ED Notes (Signed)
Patient moved to room C20 with sitter.  Staffing office aware of the patient being moved to room C20

## 2013-12-16 NOTE — ED Notes (Signed)
Pt valuables and belongings sent with pt via [facility]

## 2013-12-16 NOTE — Progress Notes (Signed)
Patient ID: [name], male   DOB: 17-Sep-1965, 48 y.o.   MRN: [id number] Admission Note-[facility] ED to OBS to continue detox and to coordinate a rehab program for him at the end of his 23 hours. He states he would also like to be evaluated for medications for bipolar illness which he states he was diagnosed with about 6 years ago in [place]. 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 He now has a car back. He is an insulin dependent diabetic and has hypertension. He is not compliant with his treatment, his wife took care of much of it. He is pleasant and cooperative with the admission. He has a flat affect, and complains of depression and irritability as well as thoughts to hurt self and his wife,but can contract for safety while here.He has a history of alcohol and cocaine use and states last use today and uses 3-4 times a week. His UDS was positive for cocaine and his alcohol level was negative. He wanted to be a triple x so his wife cant visit him or know he is here. He would like to go from here to a long term rehab program. He is not currently experiencing any withdrawal sx.

## 2013-12-16 NOTE — Progress Notes (Signed)
Patient ID: Janene HarveyDeno XXXCanady, male   DOB: Oct 29, 1965, 48 y.o.   MRN: [id number] Knows the other male client admitted and talking together about people they know form previous programs, meetings and the street. He does not follow his diabetic diet or manage his hypertension, so negotiating for additional food, not wanting to follow any diabetic recommendations. Required 2 Units of Novolog on his sliding scale for a CBG of 144. Cooperative with medications. BP at 1730 was 150/102 and given his scheduled Lisinipril.

## 2013-12-16 NOTE — ED Notes (Signed)
Pt has finished his lunch. [facility] called to transport

## 2013-12-16 NOTE — ED Notes (Signed)
Patient stated he has been using alcohol and cocaine for years and last used was early Monday morning.  States he has not been taking his maniac depression meds for about 6 years.  States he is looking for help to get off cocaine and alcohol

## 2013-12-16 NOTE — Progress Notes (Signed)
Patient ID: Stephen Guzman, male   DOB: 1965/09/15, 48 y.o.   MRN: [id number]  Patient pleasant and cooperative with care. Pt states he wants to go to a long term treatment facility. Pt states he is separated from his wife and has nowhere to go currently. Pt denies SI/HI or hallucinations at this time. No s/s of distress noted.

## 2013-12-16 NOTE — BH Assessment (Signed)
Spoke with  ED nurse Drinda ButtsAnnette who will set up Telepsych cart for TTS assessment.  Attempted to reach EDP @[phone] , unable to reach and will proceed with patient assessment as to not  further delay patient care.   Stephen PeachNajah Dayln Tugwell, MS, LCASA Assessment Counselor

## 2013-12-16 NOTE — ED Notes (Signed)
Patient states that he has a lot of things going on in his life and that is why he stated he was homocidal and suicidal

## 2013-12-16 NOTE — ED Notes (Signed)
Patient changed into maroon colored scrubs and blue socks.  Laying in bed watching TV.  This nurse at bedside.  Staffing office notified of the need for a sitter.

## 2013-12-16 NOTE — ED Notes (Signed)
Patient alert and aware of all surroundings.

## 2013-12-16 NOTE — ED Notes (Signed)
tts in progress 

## 2013-12-16 NOTE — ED Notes (Signed)
Pt's belongings inventoried and placed with security and in the utility room. Pt has been wanded by security.

## 2013-12-17 DIAGNOSIS — F329 Major depressive disorder, single episode, unspecified: Secondary | ICD-10-CM

## 2013-12-17 DIAGNOSIS — F1099 Alcohol use, unspecified with unspecified alcohol-induced disorder: Secondary | ICD-10-CM

## 2013-12-17 DIAGNOSIS — F1994 Other psychoactive substance use, unspecified with psychoactive substance-induced mood disorder: Secondary | ICD-10-CM

## 2013-12-17 LAB — GLUCOSE, CAPILLARY: Glucose-Capillary: 142 mg/dL — ABNORMAL HIGH (ref 70–99)

## 2013-12-17 MED ORDER — GABAPENTIN 100 MG PO CAPS: 100.0000 mg | ORAL_CAPSULE | Freq: Three times a day (TID) | ORAL | Status: AC

## 2013-12-17 MED ORDER — HYDROXYZINE HCL 25 MG PO TABS: 25.0000 mg | ORAL_TABLET | Freq: Four times a day (QID) | ORAL | Status: AC | PRN

## 2013-12-17 MED ORDER — GLIMEPIRIDE 2 MG PO TABS: 2.0000 mg | ORAL_TABLET | Freq: Every day | ORAL | Status: AC

## 2013-12-17 MED ORDER — INSULIN GLARGINE 100 UNIT/ML ~~LOC~~ SOLN: 52.0000 [IU] | Freq: Every day | SUBCUTANEOUS | Status: AC

## 2013-12-17 MED ORDER — TRAZODONE HCL 50 MG PO TABS: 50.0000 mg | ORAL_TABLET | Freq: Every evening | ORAL | Status: AC | PRN

## 2013-12-17 MED ORDER — METFORMIN HCL 500 MG PO TABS: 1000.0000 mg | ORAL_TABLET | Freq: Two times a day (BID) | ORAL | Status: AC

## 2013-12-17 MED ORDER — LISINOPRIL 5 MG PO TABS: 5.0000 mg | ORAL_TABLET | Freq: Every day | ORAL | Status: AC

## 2013-12-17 NOTE — H&P (Signed)
[facility] OBS UNIT H&P   Subjective: Pt seen and chart reviewed. Pt reports that he spent the night in the OBS unit as a means to detox and also process his stressful situation with his wife and their conflict over homes and vehicles in the midst of a breakup and possible divorce. Pt reports that he had situational suicidal thoughts last night upon entry to the OBS Unit and that these have now resolved. Pt denies SI, HI, and AVH, contracts for safety. Pt reports that he would like to go to inpatient rehab. However, pt became irate with nursing staff stating that he was upset he did not meet detox criteria for [facility]. Initially, pt reported that [facility] due to his car being in the parking lot, then he reported that he wanted to stay, then later wanted to leave immediately. Pt was discharged with outpatient resources [facility].   HPI: Stephen Guzman is an 48 y.o. male. Pt presents to [facility] with C/O increased Depression and Substance Addiction. Pt's presentse agitated and angry. Pt states, " I need help". Pt endorses situational SI with thoughts of consuming as much etoh and crack as possible that his heart will stop due to his stressors. Pt reports binge etoh and crack/cocaine use approximately 3-4 times per week.  Pt reports that he last drank etoh and smoke crack on 12-16-13 between 130-2:00am.  Pt reports that he smoked $150 worth of crack and drank 3 bottles of wine and a couple 40 oz beers. Patient reports that he will be evicted from his house this month as he will not present to court for eviction procedings on designated court day because he has no job or money to pay his rent. Pt reports that he and his wife separated earlier this month and she took both vehicles and all Television's in the home leaving him with pretty much nothing. Pt reports that he had Homicidal ideations towards his wife weeks ago. Pt denies current HI towards his wife stating he is angry with her and does  not know what he will do if he see's her. Pt reports recently getting one of his vehicles back from his wife.  Pt reports a history of being diagnosed with Bipolar Disorder Diagnosis and Manic Depression while receiving treatment in [place] years ago when he lived there. Pt reports that he has not slept in 3 days and reports increased nightmares. Pt reports non-compliant with his medication for the past 20 days. Pt is unable to reliably contract for safety.  Consulted with Clementeen HoofSean Taylor and Renata Capriceonrad Weslie Rasmus-FNP whom is recommending that patient be admitted to observation unit for treatment. Pt assigned to observation bed #4. Attending Physician assigned is Dr. Nelly RoutArchana Kumar.  EDP Dr.Nanavati informed of pt's acceptance to obs unit. ED Nurse Drinda Buttsnnette has been notified and agreeable to have patient sign obs unit voluntary consent form and will coordinate transport via [facility].   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 Diabetes mellitus without complication   . Hypercholesteremia   . Manic depression   . Hypertension    Axis IV: economic problems, housing problems, other psychosocial or environmental problems and problems related to social environment Axis V: 51-60 moderate symptoms  Past Medical History:  Past Medical History  Diagnosis Date  . Diabetes mellitus without complication   . Hypercholesteremia   .  Manic depression   . Hypertension     Past Surgical History  Procedure Laterality Date  . Total hip arthroplasty      Family History: History reviewed. No pertinent family history.  Social History:  reports that he has been smoking Cigarettes.  He has been smoking about 0.50 packs per day. He does not have any smokeless tobacco history on file. He reports that he drinks about 14.4 ounces of alcohol per week. He reports  that he uses illicit drugs (Cocaine).  Additional Social History:  Alcohol / Drug Use Pain Medications: Not abusing Prescriptions: not abusing Over the Counter: not abusing History of alcohol / drug use?: Yes Negative Consequences of Use: Work / School;Personal relationships;Financial Withdrawal Symptoms: Sweats Substance #1 Name of Substance 1: alcohol 1 - Age of First Use: 5 1 - Amount (size/oz): 3 bottles of wine 1 - Frequency: 3-4 times a week 1 - Duration: ongoing 1 - Last Use / Amount: 2 am today Substance #2 Name of Substance 2: cocaine 2 - Age of First Use: unknkown 2 - Amount (size/oz): 150 dollars a day 2 - Frequency: 3-4 times a week 2 - Duration: ongoing 2 - Last Use / Amount: this am  CIWA: CIWA-Ar BP: 126/105 mmHg Pulse Rate: 77 Nausea and Vomiting: no nausea and no vomiting Tactile Disturbances: none Tremor: no tremor Auditory Disturbances: not present Paroxysmal Sweats: barely perceptible sweating, palms moist Visual Disturbances: not present Anxiety: mildly anxious Headache, Fullness in Head: none present Agitation: normal activity Orientation and Clouding of Sensorium: oriented and can do serial additions CIWA-Ar Total: 2 COWS:    PATIENT STRENGTHS: (choose at least two) Ability for insight Average or above average intelligence  Allergies: No Known Allergies  Home Medications:  Medications Prior to Admission  Medication Sig Dispense Refill  . gabapentin (NEURONTIN) 100 MG capsule Take 1 capsule (100 mg total) by mouth 3 (three) times daily. For substance withdrawal syndrome  90 capsule  0  . glimepiride (AMARYL) 2 MG tablet Take 1 tablet (2 mg total) by mouth daily with breakfast. For diabetes management  30 tablet  0  . insulin aspart (NOVOLOG) 100 UNIT/ML injection Sliding scale as directed by medical physician. Per Patient uses sliding scale at home  10 mL  11  . insulin glargine (LANTUS) 100 UNIT/ML injection Inject 0.52 mLs (52 Units total)  into the skin at bedtime.  10 mL  11  . lisinopril (PRINIVIL,ZESTRIL) 5 MG tablet Take 1 tablet (5 mg total) by mouth daily. For hypertension  30 tablet  0  . metFORMIN (GLUCOPHAGE) 500 MG tablet Take 2 tablets (1,000 mg total) by mouth 2 (two) times daily with a meal. For diabetes  120 tablet  0  . traZODone (DESYREL) 50 MG tablet Take 1 tablet (50 mg total) by mouth at bedtime as needed for sleep.  30 tablet  0    Disposition:  -Discharge home with outpatient resources for followup for substance abuse and depression.   Beau FannyWithrow, Akul Leggette C, FNP-BC 12/17/2013 9:05 AM

## 2013-12-17 NOTE — BH Assessment (Signed)
[facility] Assessment Progress Note  At 10:16 I spoke to [place] [facility].  She reports that there is a wait list for treatment (rehabilitation) beds at their facility.  I then spoke to the pt and to Claudette Headonrad Withrow, NP.  It has been decided that pt is to be discharged with referrals to area facilities that provide residential rehabilitation, as well as outpatient treatment providers for his substance abuse problems.  He [facility] also be [facility] for [facility] services.  Doylene Canninghomas Alphonso Gregson, MA Triage Specialist 12/17/2013 @ 10:38

## 2013-12-17 NOTE — Discharge Instructions (Signed)
For your ongoing behavioral health needs, you are advised to follow up with [facility].  They are able to provide outpatient psychiatry/medication management, therapy, and substance abuse treatment:       [facility]      [address]      [place], [place] [address]       [phone]  Several facilities in the area provide residential substance abuse treatment.  If you feel that you need this type of treatment in order to maintain a sober lifestyle, contact them at your earliest opportunity.  There may be a waiting list for their services:       [facility]      [address]      [place], [place] [address]      [phone]       [facility]      [address]      [place], [place] [address]      [phone]       [facility]      [address]      [place], [place] [address]      [phone]  Another option to consider is [facility].  They provide both outpatient substance abuse treatment, as well as transitional housing.  Their treatment program starts a 9:00 am, Monday - Friday, and clients are welcome to start on a walk-in basis.  There is usually a wait list for transitional housing:       [facility]      [address]      [place], [place] [address]      [phone]

## 2013-12-17 NOTE — Discharge Summary (Signed)
[facility] OBS UNIT DISCHARGE SUMMARY   Subjective: Pt seen and chart reviewed. Pt reports that he spent the night in the OBS unit as a means to detox and also process his stressful situation with his wife and their conflict over homes and vehicles in the midst of a breakup and possible divorce. Pt reports that he had situational suicidal thoughts last night upon entry to the OBS Unit and that these have now resolved. Pt denies SI, HI, and AVH, contracts for safety. Pt reports that he would like to go to inpatient rehab. However, pt became irate with nursing staff stating that he was upset he did not meet detox criteria [facility]. Initially, pt reported that [facility] due to his car being in the parking lot, then he reported that he wanted to stay, then later wanted to leave immediately. Pt was discharged with outpatient resources provided by Tom [facility].   HPI: Janene HarveyDeno XXXCanady is an 48 y.o. male. Pt presents to [facility] with C/O increased Depression and Substance Addiction. Pt's presentse agitated and angry. Pt states, " I need help". Pt endorses situational SI with thoughts of consuming as much etoh and crack as possible that his heart will stop due to his stressors. Pt reports binge etoh and crack/cocaine use approximately 3-4 times per week.  Pt reports that he last drank etoh and smoke crack on 12-16-13 between 130-2:00am.  Pt reports that he smoked $150 worth of crack and drank 3 bottles of wine and a couple 40 oz beers. Patient reports that he will be evicted from his house this month as he will not present to court for eviction procedings on designated court day because he has no job or money to pay his rent. Pt reports that he and his wife separated earlier this month and she took both vehicles and all Television's in the home leaving him with pretty much nothing. Pt reports that he had Homicidal ideations towards his wife weeks ago. Pt denies current HI towards his wife stating he is angry with  her and does not know what he will do if he see's her. Pt reports recently getting one of his vehicles back from his wife.  Pt reports a history of being diagnosed with Bipolar Disorder Diagnosis and Manic Depression while receiving treatment in [place] years ago when he lived there. Pt reports that he has not slept in 3 days and reports increased nightmares. Pt reports non-compliant with his medication for the past 20 days. Pt is unable to reliably contract for safety.  Consulted with Clementeen HoofSean Taylor and Renata Capriceonrad Ellouise Mcwhirter-FNP whom is recommending that patient be admitted to observation unit for treatment. Pt assigned to observation bed #4. Attending Physician assigned is Dr. Nelly RoutArchana Kumar.  EDP Dr.Nanavati informed of pt's acceptance to obs unit. ED Nurse Drinda Buttsnnette has been notified and agreeable to have patient sign obs unit voluntary consent form and will coordinate transport via [facility].   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 Diabetes mellitus without complication   . Hypercholesteremia   . Manic depression   . Hypertension    Axis IV: economic problems, housing problems, other psychosocial or environmental problems and problems related to social environment Axis V: 51-60 moderate symptoms  Past Medical History:  Past Medical History  Diagnosis Date  . Diabetes mellitus without complication   .  Hypercholesteremia   . Manic depression   . Hypertension     Past Surgical History  Procedure Laterality Date  . Total hip arthroplasty      Family History: History reviewed. No pertinent family history.  Social History:  reports that he has been smoking Cigarettes.  He has been smoking about 0.50 packs per day. He does not have any smokeless tobacco history on file. He reports that he drinks about 14.4 ounces of alcohol per  week. He reports that he uses illicit drugs (Cocaine).  Additional Social History:  Alcohol / Drug Use Pain Medications: Not abusing Prescriptions: not abusing Over the Counter: not abusing History of alcohol / drug use?: Yes Negative Consequences of Use: Work / School;Personal relationships;Financial Withdrawal Symptoms: Sweats Substance #1 Name of Substance 1: alcohol 1 - Age of First Use: 5 1 - Amount (size/oz): 3 bottles of wine 1 - Frequency: 3-4 times a week 1 - Duration: ongoing 1 - Last Use / Amount: 2 am today Substance #2 Name of Substance 2: cocaine 2 - Age of First Use: unknkown 2 - Amount (size/oz): 150 dollars a day 2 - Frequency: 3-4 times a week 2 - Duration: ongoing 2 - Last Use / Amount: this am  CIWA: CIWA-Ar BP: 126/105 mmHg Pulse Rate: 77 Nausea and Vomiting: no nausea and no vomiting Tactile Disturbances: none Tremor: no tremor Auditory Disturbances: not present Paroxysmal Sweats: barely perceptible sweating, palms moist Visual Disturbances: not present Anxiety: mildly anxious Headache, Fullness in Head: none present Agitation: normal activity Orientation and Clouding of Sensorium: oriented and can do serial additions CIWA-Ar Total: 2 COWS:    PATIENT STRENGTHS: (choose at least two) Ability for insight Average or above average intelligence  Allergies: No Known Allergies  Home Medications:  Medications Prior to Admission  Medication Sig Dispense Refill  . gabapentin (NEURONTIN) 100 MG capsule Take 1 capsule (100 mg total) by mouth 3 (three) times daily. For substance withdrawal syndrome  90 capsule  0  . glimepiride (AMARYL) 2 MG tablet Take 1 tablet (2 mg total) by mouth daily with breakfast. For diabetes management  30 tablet  0  . insulin aspart (NOVOLOG) 100 UNIT/ML injection Sliding scale as directed by medical physician. Per Patient uses sliding scale at home  10 mL  11  . insulin glargine (LANTUS) 100 UNIT/ML injection Inject 0.52 mLs  (52 Units total) into the skin at bedtime.  10 mL  11  . lisinopril (PRINIVIL,ZESTRIL) 5 MG tablet Take 1 tablet (5 mg total) by mouth daily. For hypertension  30 tablet  0  . metFORMIN (GLUCOPHAGE) 500 MG tablet Take 2 tablets (1,000 mg total) by mouth 2 (two) times daily with a meal. For diabetes  120 tablet  0  . traZODone (DESYREL) 50 MG tablet Take 1 tablet (50 mg total) by mouth at bedtime as needed for sleep.  30 tablet  0    Disposition:  -Discharge home with outpatient resources for followup for substance abuse and depression.   Beau FannyWithrow, Dorsel Flinn C, FNP-BC 12/17/2013  3:44 PM

## 2013-12-17 NOTE — Progress Notes (Signed)
Patient ID: Stephen Guzman XXXCanady, male   DOB: September 30, 1965, 48 y.o.   MRN: [id number] D-Seen this am by Renata Capriceonrad NP and client making his case to stay here, states because he is a diabetic using chronically and has been inpatient a number of times he should stay. Told by NP that he agrees he needs help but [facility] now, that [facility] case worker will work with him to find him rehab from here. He asked to go now and to give him a cab voucher to leave. He then agreed to wait on Tom to see if something and somewhere could be arranged. He has been non compliant with diabetic diet even while here, arguing and negotiating for food not on his food list and more food than would be available to him on his carb modified diet. He has not had any psychotic sx, and is denying suicidal or homicidal thoughts, only thinking of using again.

## 2013-12-17 NOTE — Progress Notes (Signed)
Patient ID: Janene HarveyDeno XXXCanady, male   DOB: [birth year], 48 y.o.   MRN: [id number] Discharge Note-He initiated calling [facility] to get help with rehab placement from here, but he only ended up being frustrated and not felt helped. He spoke with [facility] Disposition coordinator but he could only offer him outpatient refrerrals and he was not interested in those, states knows where to go, he wants inpatient. Continues to deny any thoughts to hurt self or others. Concerned he will be homeless at the end of the week since he and his wife are separating, he lost his job, and they didn't pay their rent so they got a conviction notice for the end of the month. He does have [facility] care and [facility]. He is requesting a cab to his car and he will manage from there. Rx given to him but he declined them since he has no money to fill them, and he has not been taking medications prior to admission here. Rx shredded. [facility] per [facility] transport. All property returned, changed into his own clothes and taken to the lobby. He called a friend before leaving and he may give him some money for gas, states if he does he will go on to [place] which is where he lived three years ago before moving here to " have a better life."

## 2013-12-18 NOTE — H&P (Signed)
Case discussed, agree with plan for admission to OBS

## 2013-12-18 NOTE — Discharge Summary (Signed)
Case discussed, agree with plan 

## 2014-08-11 ENCOUNTER — Emergency Department (HOSPITAL_COMMUNITY)
Admission: EM | Admit: 2014-08-11 | Discharge: 2014-08-11 | Disposition: A | Discharge status: Home / Self Care | Payer: Self-pay | Attending: Emergency Medicine | Admitting: Emergency Medicine

## 2014-08-11 ENCOUNTER — Encounter (HOSPITAL_COMMUNITY): Payer: Self-pay | Admitting: Emergency Medicine

## 2014-08-11 DIAGNOSIS — Z79899 Other long term (current) drug therapy: Secondary | ICD-10-CM | POA: Insufficient documentation

## 2014-08-11 DIAGNOSIS — F319 Bipolar disorder, unspecified: Secondary | ICD-10-CM | POA: Insufficient documentation

## 2014-08-11 DIAGNOSIS — Z794 Long term (current) use of insulin: Secondary | ICD-10-CM | POA: Insufficient documentation

## 2014-08-11 DIAGNOSIS — I10 Essential (primary) hypertension: Secondary | ICD-10-CM | POA: Insufficient documentation

## 2014-08-11 DIAGNOSIS — Z720 Tobacco use: Secondary | ICD-10-CM | POA: Insufficient documentation

## 2014-08-11 DIAGNOSIS — E1142 Type 2 diabetes mellitus with diabetic polyneuropathy: Secondary | ICD-10-CM | POA: Insufficient documentation

## 2014-08-11 LAB — CBC WITH DIFFERENTIAL/PLATELET
BASOS ABS: 0 10*3/uL (ref 0.0–0.1)
Basophils Relative: 0 % (ref 0–1)
EOS PCT: 1 % (ref 0–5)
Eosinophils Absolute: 0.1 10*3/uL (ref 0.0–0.7)
HEMATOCRIT: 41.2 % (ref 39.0–52.0)
Hemoglobin: 14.3 g/dL (ref 13.0–17.0)
Lymphocytes Relative: 47 % — ABNORMAL HIGH (ref 12–46)
Lymphs Abs: 4.1 10*3/uL — ABNORMAL HIGH (ref 0.7–4.0)
MCH: 31.5 pg (ref 26.0–34.0)
MCHC: 34.7 g/dL (ref 30.0–36.0)
MCV: 90.7 fL (ref 78.0–100.0)
MONO ABS: 0.6 10*3/uL (ref 0.1–1.0)
Monocytes Relative: 7 % (ref 3–12)
NEUTROS ABS: 4 10*3/uL (ref 1.7–7.7)
Neutrophils Relative %: 45 % (ref 43–77)
Platelets: 208 10*3/uL (ref 150–400)
RBC: 4.54 MIL/uL (ref 4.22–5.81)
RDW: 13.8 % (ref 11.5–15.5)
WBC: 8.8 10*3/uL (ref 4.0–10.5)

## 2014-08-11 LAB — COMPREHENSIVE METABOLIC PANEL
ALT: 78 U/L — ABNORMAL HIGH (ref 17–63)
AST: 71 U/L — ABNORMAL HIGH (ref 15–41)
Albumin: 3.5 g/dL (ref 3.5–5.0)
Alkaline Phosphatase: 88 U/L (ref 38–126)
Anion gap: 9 (ref 5–15)
BUN: 9 mg/dL (ref 6–20)
CALCIUM: 8.6 mg/dL — AB (ref 8.9–10.3)
CO2: 23 mmol/L (ref 22–32)
CREATININE: 0.69 mg/dL (ref 0.61–1.24)
Chloride: 103 mmol/L (ref 101–111)
GLUCOSE: 149 mg/dL — AB (ref 65–99)
Potassium: 3.7 mmol/L (ref 3.5–5.1)
Sodium: 135 mmol/L (ref 135–145)
TOTAL PROTEIN: 7.3 g/dL (ref 6.5–8.1)
Total Bilirubin: 0.6 mg/dL (ref 0.3–1.2)

## 2014-08-11 LAB — CBG MONITORING, ED: Glucose-Capillary: 153 mg/dL — ABNORMAL HIGH (ref 65–99)

## 2014-08-11 NOTE — ED Provider Notes (Signed)
CSN: [id number]     Arrival date & time 08/11/14  0203 History  This chart was scribed for Tomasita Crumble, MD by Octavia Heir, ED Scribe. This patient was seen in room A01C/A01C and the patient's care was started at 3:17 AM.     Chief Complaint  Patient presents with  . Numbness     The history is provided by the patient. No language interpreter was used.    HPI Comments: Stephen Guzman is a 49 y.o. male who has PMHx of DM presents to the Emergency Department complaining of constant, gradual worsening bilateral feet numbness onset last week. Pt notes he went tried to be seen yesterday at [facility] but was unable to due to capacity .Pt notes he stands a lot as apart of his job. He notes having no prior episodes similar to this. He notes he is not compliant with his BP medication. Pt denies wounds and injury.   Past Medical History  Diagnosis Date  . Diabetes mellitus without complication   . Hypercholesteremia   . Manic depression   . Hypertension    Past Surgical History  Procedure Laterality Date  . Total hip arthroplasty     No family history on file. History  Substance Use Topics  . Smoking status: Current Every Day Smoker -- 0.50 packs/day    Types: Cigarettes  . Smokeless tobacco: Not on file  . Alcohol Use: Yes    Review of Systems  A complete 10 system review of systems was obtained and all systems are negative except as noted in the HPI and PMH.          Allergies  Review of patient's allergies indicates no known allergies.  Home Medications   Prior to Admission medications   Medication Sig Start Date End Date Taking? Authorizing Provider  gabapentin (NEURONTIN) 100 MG capsule Take 1 capsule (100 mg total) by mouth 3 (three) times daily. For substance withdrawal syndrome 12/17/13   Beau Fanny, FNP  glimepiride (AMARYL) 2 MG tablet Take 1 tablet (2 mg total) by mouth daily with breakfast. For diabetes management 12/17/13   Beau Fanny, FNP   hydrOXYzine (ATARAX/VISTARIL) 25 MG tablet Take 1 tablet (25 mg total) by mouth every 6 (six) hours as needed for anxiety. 12/17/13   Beau Fanny, FNP  insulin glargine (LANTUS) 100 UNIT/ML injection Inject 0.52 mLs (52 Units total) into the skin at bedtime. 12/17/13   Beau Fanny, FNP  lisinopril (PRINIVIL,ZESTRIL) 5 MG tablet Take 1 tablet (5 mg total) by mouth daily. For hypertension 12/17/13   Beau Fanny, FNP  metFORMIN (GLUCOPHAGE) 500 MG tablet Take 2 tablets (1,000 mg total) by mouth 2 (two) times daily with a meal. For diabetes 12/17/13   Beau Fanny, FNP  traZODone (DESYREL) 50 MG tablet Take 1 tablet (50 mg total) by mouth at bedtime as needed for sleep. 12/17/13   Beau Fanny, FNP   Triage vitals: BP 175/120 mmHg  Pulse 85  Temp(Src) 98.1 F (36.7 C) (Oral)  Resp 22  Ht 6' (1.829 m)  Wt 241 lb (109.317 kg)  BMI 32.68 kg/m2  SpO2 98% Physical Exam  Constitutional: He is oriented to person, place, and time. Vital signs are normal. He appears well-developed and well-nourished.  Non-toxic appearance. He does not appear ill. No distress.  HENT:  Head: Normocephalic and atraumatic.  Nose: Nose normal.  Mouth/Throat: Oropharynx is clear and moist. No oropharyngeal exudate.  Eyes: Conjunctivae and EOM  are normal. Pupils are equal, round, and reactive to light. No scleral icterus.  Neck: Normal range of motion. Neck supple. No tracheal deviation, no edema, no erythema and normal range of motion present. No thyroid mass and no thyromegaly present.  Cardiovascular: Normal rate, regular rhythm, S1 normal, S2 normal, normal heart sounds, intact distal pulses and normal pulses.  Exam reveals no gallop and no friction rub.   No murmur heard. Pulses:      Radial pulses are 2+ on the right side, and 2+ on the left side.       Dorsalis pedis pulses are 2+ on the right side, and 2+ on the left side.  Pulmonary/Chest: Effort normal and breath sounds normal. No respiratory  distress. He has no wheezes. He has no rhonchi. He has no rales.  Abdominal: Soft. Normal appearance and bowel sounds are normal. He exhibits no distension, no ascites and no mass. There is no hepatosplenomegaly. There is no tenderness. There is no rebound, no guarding and no CVA tenderness.  Musculoskeletal: Normal range of motion. He exhibits no edema or tenderness.  Numbness in bilateral feet  Lymphadenopathy:    He has no cervical adenopathy.  Neurological: He is alert and oriented to person, place, and time. He has normal strength. No cranial nerve deficit or sensory deficit.  Skin: Skin is warm, dry and intact. No petechiae and no rash noted. He is not diaphoretic. No erythema. No pallor.  Psychiatric: He has a normal mood and affect. His behavior is normal. Judgment normal.  Nursing note and vitals reviewed.   ED Course  Procedures  DIAGNOSTIC STUDIES: Oxygen Saturation is 98% on RA, normal by my interpretation.  COORDINATION OF CARE:  3:23 AM Discussed treatment plan which includes follow up with PCP with pt at bedside and pt agreed to plan.   Labs Review Labs Reviewed  CBC WITH DIFFERENTIAL/PLATELET - Abnormal; Notable for the following:    Lymphocytes Relative 47 (*)    Lymphs Abs 4.1 (*)    All other components within normal limits  COMPREHENSIVE METABOLIC PANEL - Abnormal; Notable for the following:    Glucose, Bld 149 (*)    Calcium 8.6 (*)    AST 71 (*)    ALT 78 (*)    All other components within normal limits  CBG MONITORING, ED - Abnormal; Notable for the following:    Glucose-Capillary 153 (*)    All other components within normal limits    Imaging Review No results found.   EKG Interpretation None      MDM   Final diagnoses:  None     patient presents to the emergency department for numbness in his feet for the past 2 weeks. He does have history of diabetes and states it has been uncontrolled at times. His fingersticks have been the 300s, he  admits to not taking his medicines. Education was provided regarding diabetic neuropathy and how it is not reversible. Patient also noncompliant with his high blood pressure medications, he was educated on the importance of keeping his blood  Pressure and control. He is currently asymptomatic tonight. His blood pressure is likely normal for him. He states he has medicines at home and will take them as soon as he gets home. He otherwise appears well in no acute distress. His vital signs remain within his normal limits and he is safe for discharge.  I personally performed the services described in this documentation, which was scribed in my presence. The recorded information  has been reviewed and is accurate.   Tomasita Crumble, MD 08/11/14 [phone]

## 2014-08-11 NOTE — Discharge Instructions (Signed)
Diabetic Neuropathy  Stephen Guzman,  You likely have numbness in your feet due to high blood sugar. Be sure to  Keep your blood sugar under tight control. Also take all blood pressure medications as prescribed. This is very important for your overall health. See primary care physician within 3 days for close follow-up. If symptoms worsen come back to the emergency department immediately. Thank you. Diabetic neuropathy is a nerve disease or nerve damage that is caused by diabetes mellitus. About half of all people with diabetes mellitus have some form of nerve damage. Nerve damage is more common in those who have had diabetes mellitus for many years and who generally have not had good control of their blood sugar (glucose) level. Diabetic neuropathy is a common complication of diabetes mellitus. There are three more common types of diabetic neuropathy and a fourth type that is less common and less understood:   Peripheral neuropathy--This is the most common type of diabetic neuropathy. It causes damage to the nerves of the feet and legs first and then eventually the hands and arms.The damage affects the ability to sense touch.  Autonomic neuropathy--This type causes damage to the autonomic nervous system, which controls the following functions:  Heartbeat.  Body temperature.  Blood pressure.  Urination.  Digestion.  Sweating.  Sexual function.  Focal neuropathy--Focal neuropathy can be painful and unpredictable and occurs most often in older adults with diabetes mellitus. It involves a specific nerve or one area and often comes on suddenly. It usually does not cause long-term problems.  Radiculoplexus neuropathy-- Sometimes called lumbosacral radiculoplexus neuropathy, radiculoplexus neuropathy affects the nerves of the thighs, hips, buttocks, or legs. It is more common in people with type 2 diabetes mellitus and in older men. It is characterized by debilitating pain, weakness, and atrophy,  usually in the thigh muscles. CAUSES  The cause of peripheral, autonomic, and focal neuropathies is diabetes mellitus that is uncontrolled and high glucose levels. The cause of radiculoplexus neuropathy is unknown. However, it is thought to be caused by inflammation related to uncontrolled glucose levels. SIGNS AND SYMPTOMS  Peripheral Neuropathy Peripheral neuropathy develops slowly over time. When the nerves of the feet and legs no longer work there may be:   Burning, stabbing, or aching pain in the legs or feet.  Inability to feel pressure or pain in your feet. This can lead to:  Thick calluses over pressure areas.  Pressure sores.  Ulcers.  Foot deformities.  Reduced ability to feel temperature changes.  Muscle weakness. Autonomic Neuropathy The symptoms of autonomic neuropathy vary depending on which nerves are affected. Symptoms may include:  Problems with digestion, such as:  Feeling sick to your stomach (nausea).  Vomiting.  Bloating.  Constipation.  Diarrhea.  Abdominal pain.  Difficulty with urination. This occurs if you lose your ability to sense when your bladder is full. Problems include:  Urine leakage (incontinence).  Inability to empty your bladder completely (retention).  Rapid or irregular heartbeat (palpitations).  Blood pressure drops when you stand up (orthostatic hypotension). When you stand up you may feel:  Dizzy.  Weak.  Faint.  In men, inability to attain and maintain an erection.  In women, vaginal dryness and problems with decreased sexual desire and arousal.  Problems with body temperature regulation.  Increased or decreased sweating. Focal Neuropathy  Abnormal eye movements or abnormal alignment of both eyes.  Weakness in the wrist.  Foot drop. This results in an inability to lift the foot properly and abnormal walking  or foot movement.  Paralysis on one side of your face (Bell palsy).  Chest or abdominal  pain. Radiculoplexus Neuropathy  Sudden, severe pain in your hip, thigh, or buttocks.  Weakness and wasting of thigh muscles.  Difficulty rising from a seated position.  Abdominal swelling.  Unexplained weight loss (usually more than 10 lb [4.5 kg]). DIAGNOSIS  Peripheral Neuropathy Your senses may be tested. Sensory function testing can be done with:  A light touch using a monofilament.  A vibration with tuning fork.  A sharp sensation with a pin prick. Other tests that can help diagnose neuropathy are:  Nerve conduction velocity. This test checks the transmission of an electrical current through a nerve.  Electromyography. This shows how muscles respond to electrical signals transmitted by nearby nerves.  Quantitative sensory testing. This is used to assess how your nerves respond to vibrations and changes in temperature. Autonomic Neuropathy Diagnosis is often based on reported symptoms. Tell your health care provider if you experience:   Dizziness.   Constipation.   Diarrhea.   Inappropriate urination or inability to urinate.   Inability to get or maintain an erection.  Tests that may be done include:   Electrocardiography or Holter monitor. These are tests that can help show problems with the heart rate or heart rhythm.   An X-ray exam may be done. Focal Neuropathy Diagnosis is made based on your symptoms and what your health care provider finds during your exam. Other tests may be done. They may include:  Nerve conduction velocities. This checks the transmission of electrical current through a nerve.  Electromyography. This shows how muscles respond to electrical signals transmitted by nearby nerves.  Quantitative sensory testing. This test is used to assess how your nerves respond to vibration and changes in temperature. Radiculoplexus Neuropathy  Often the first thing is to eliminate any other issue or problems that might be the cause, as there is  no stick test for diagnosis.  X-ray exam of your spine and lumbar region.  Spinal tap to rule out cancer.  MRI to rule out other lesions. TREATMENT  Once nerve damage occurs, it cannot be reversed. The goal of treatment is to keep the disease or nerve damage from getting worse and affecting more nerve fibers. Controlling your blood glucose level is the key. Most people with radiculoplexus neuropathy see at least a partial improvement over time. You will need to keep your blood glucose and HbA1c levels in the target range determined by your health care provider. Things that help control blood glucose levels include:   Blood glucose monitoring.   Meal planning.   Physical activity.   Diabetes medicine.  Over time, maintaining lower blood glucose levels helps lessen symptoms. Sometimes, prescription pain medicine is needed. HOME CARE INSTRUCTIONS:  Do not smoke.  Keep your blood glucose level in the range that you and your health care provider have determined acceptable for you.  Keep your blood pressure level in the range that you and your health care provider have determined acceptable for you.  Eat a well-balanced diet.  Be active every day.  Check your feet every day. SEEK MEDICAL CARE IF:   You have burning, stabbing, or aching pain in the legs or feet.  You are unable to feel pressure or pain in your feet.  You develop problems with digestion such as:  Nausea.  Vomiting.  Bloating.  Constipation.  Diarrhea.  Abdominal pain.  You have difficulty with urination, such as:  Incontinence.  Retention.  You have palpitations.  You develop orthostatic hypotension. When you stand up you may feel:  Dizzy.  Weak.  Faint.  You cannot attain and maintain an erection (in men).  You have vaginal dryness and problems with decreased sexual desire and arousal (in women).  You have severe pain in your thighs, legs, or buttocks.  You have unexplained weight  loss. Document Released: 04/24/2001 Document Revised: 12/04/2012 Document Reviewed: 07/25/2012 [facility] Patient Information 2015 [place], [place]. This information is not intended to replace advice given to you by your health care provider. Make sure you discuss any questions you have with your health care provider. Hypertension Hypertension is another name for high blood pressure. High blood pressure forces your heart to work harder to pump blood. A blood pressure reading has two numbers, which includes a higher number over a lower number (example: 110/72). HOME CARE   Have your blood pressure rechecked by your doctor.  Only take medicine as told by your doctor. Follow the directions carefully. The medicine does not work as well if you skip doses. Skipping doses also puts you at risk for problems.  Do not smoke.  Monitor your blood pressure at home as told by your doctor. GET HELP IF:  You think you are having a reaction to the medicine you are taking.  You have repeat headaches or feel dizzy.  You have puffiness (swelling) in your ankles.  You have trouble with your vision. GET HELP RIGHT AWAY IF:   You get a very bad headache and are confused.  You feel weak, numb, or faint.  You get chest or belly (abdominal) pain.  You throw up (vomit).  You cannot breathe very well. MAKE SURE YOU:   Understand these instructions.  Will watch your condition.  Will get help right away if you are not doing well or get worse. Document Released: 08/02/2007 Document Revised: 02/18/2013 Document Reviewed: 12/06/2012 [facility] Patient Information 2015 [place], [place]. This information is not intended to replace advice given to you by your health care provider. Make sure you discuss any questions you have with your health care provider.

## 2014-08-11 NOTE — ED Notes (Signed)
Pt. reports numbness at both feet onset last week , denies injury/ambulatory .

## 2014-08-11 NOTE — ED Notes (Signed)
Pt stable, ambulatory, states understanding of discharge instructions 

## 2015-03-11 IMAGING — CR DG CHEST 2V
2 series · 2 of 2 positions shown · non-contrast
Comparison: None.

CLINICAL DATA: Chest pain, cough, congestion

EXAM:
CHEST  2 VIEW

[w chest pa]
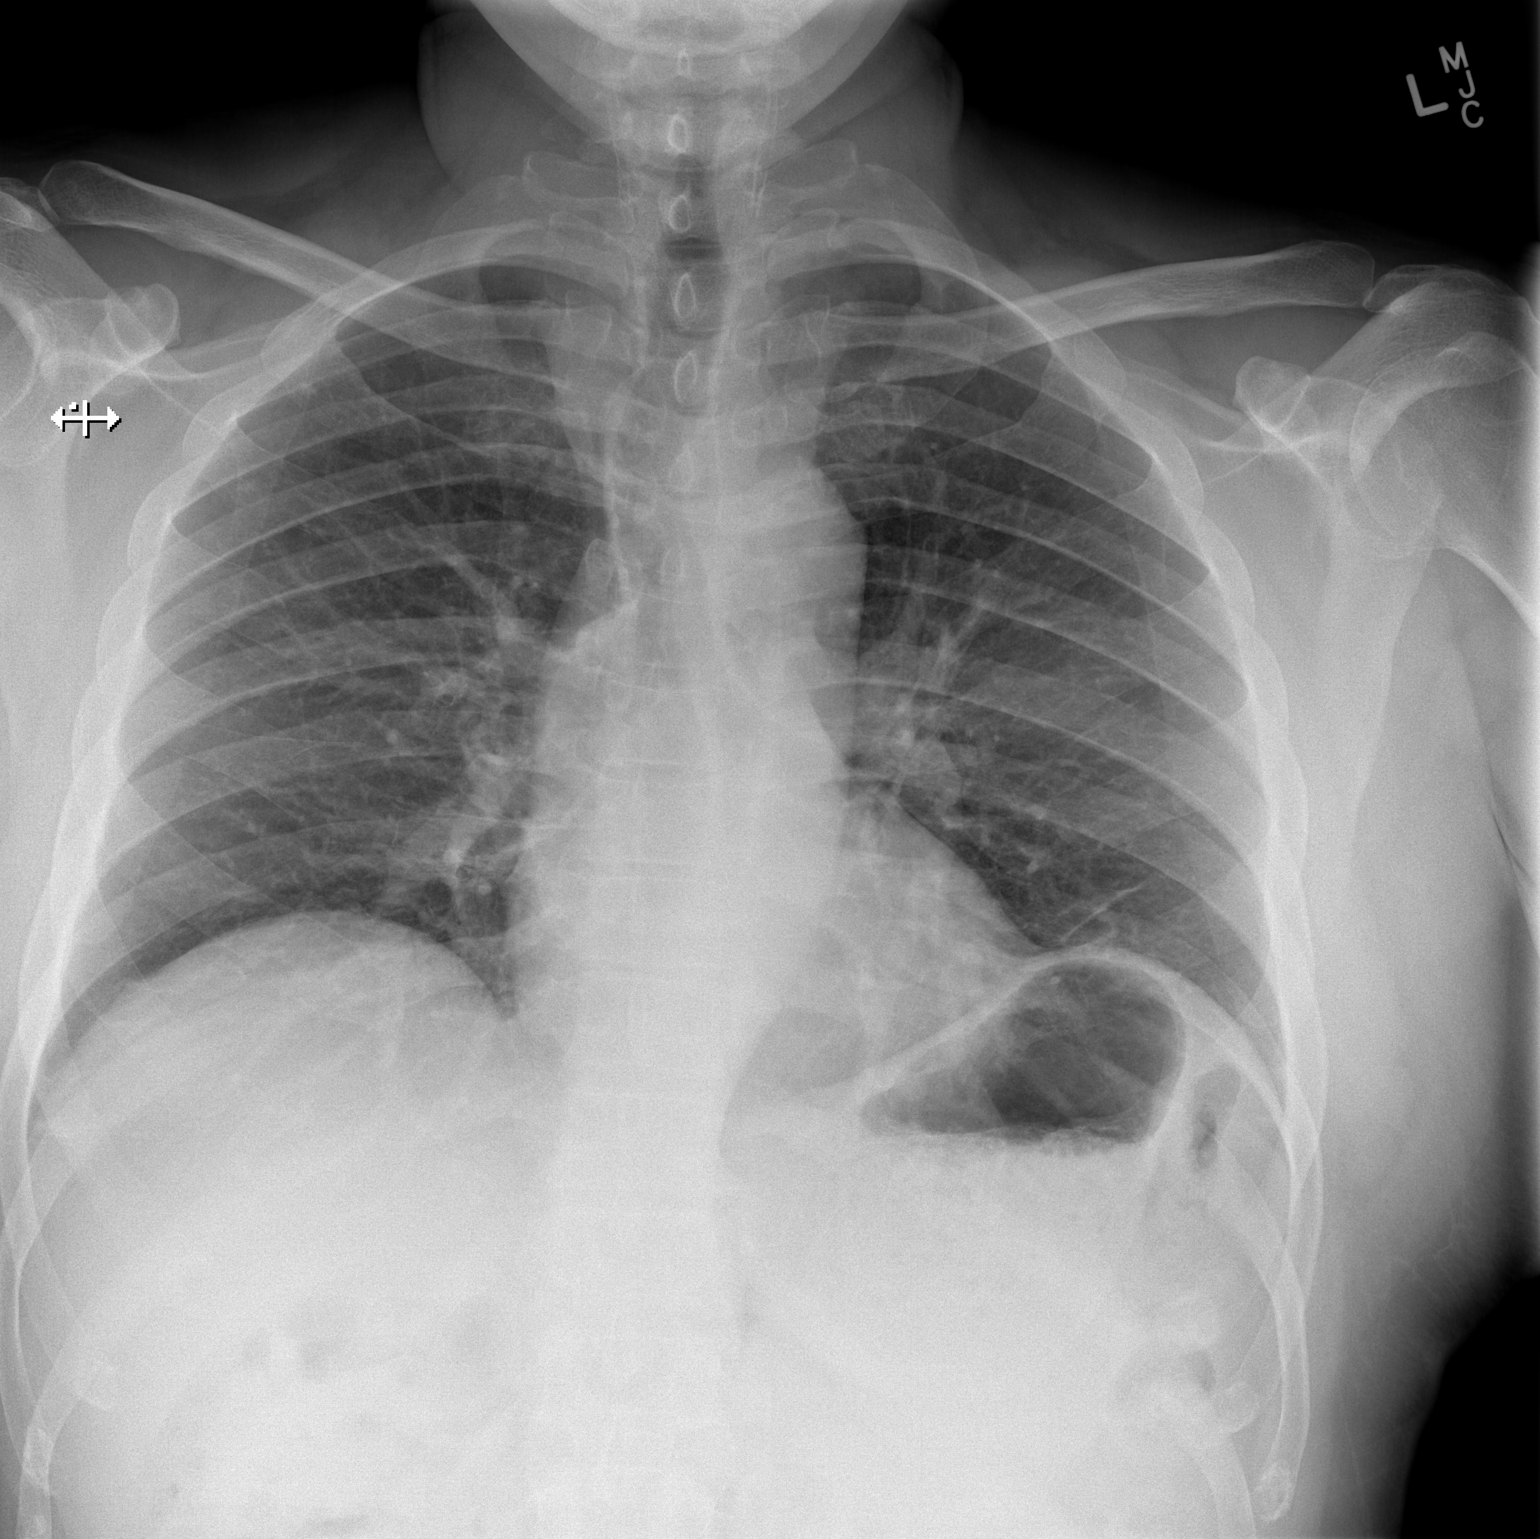

[w chest lat]
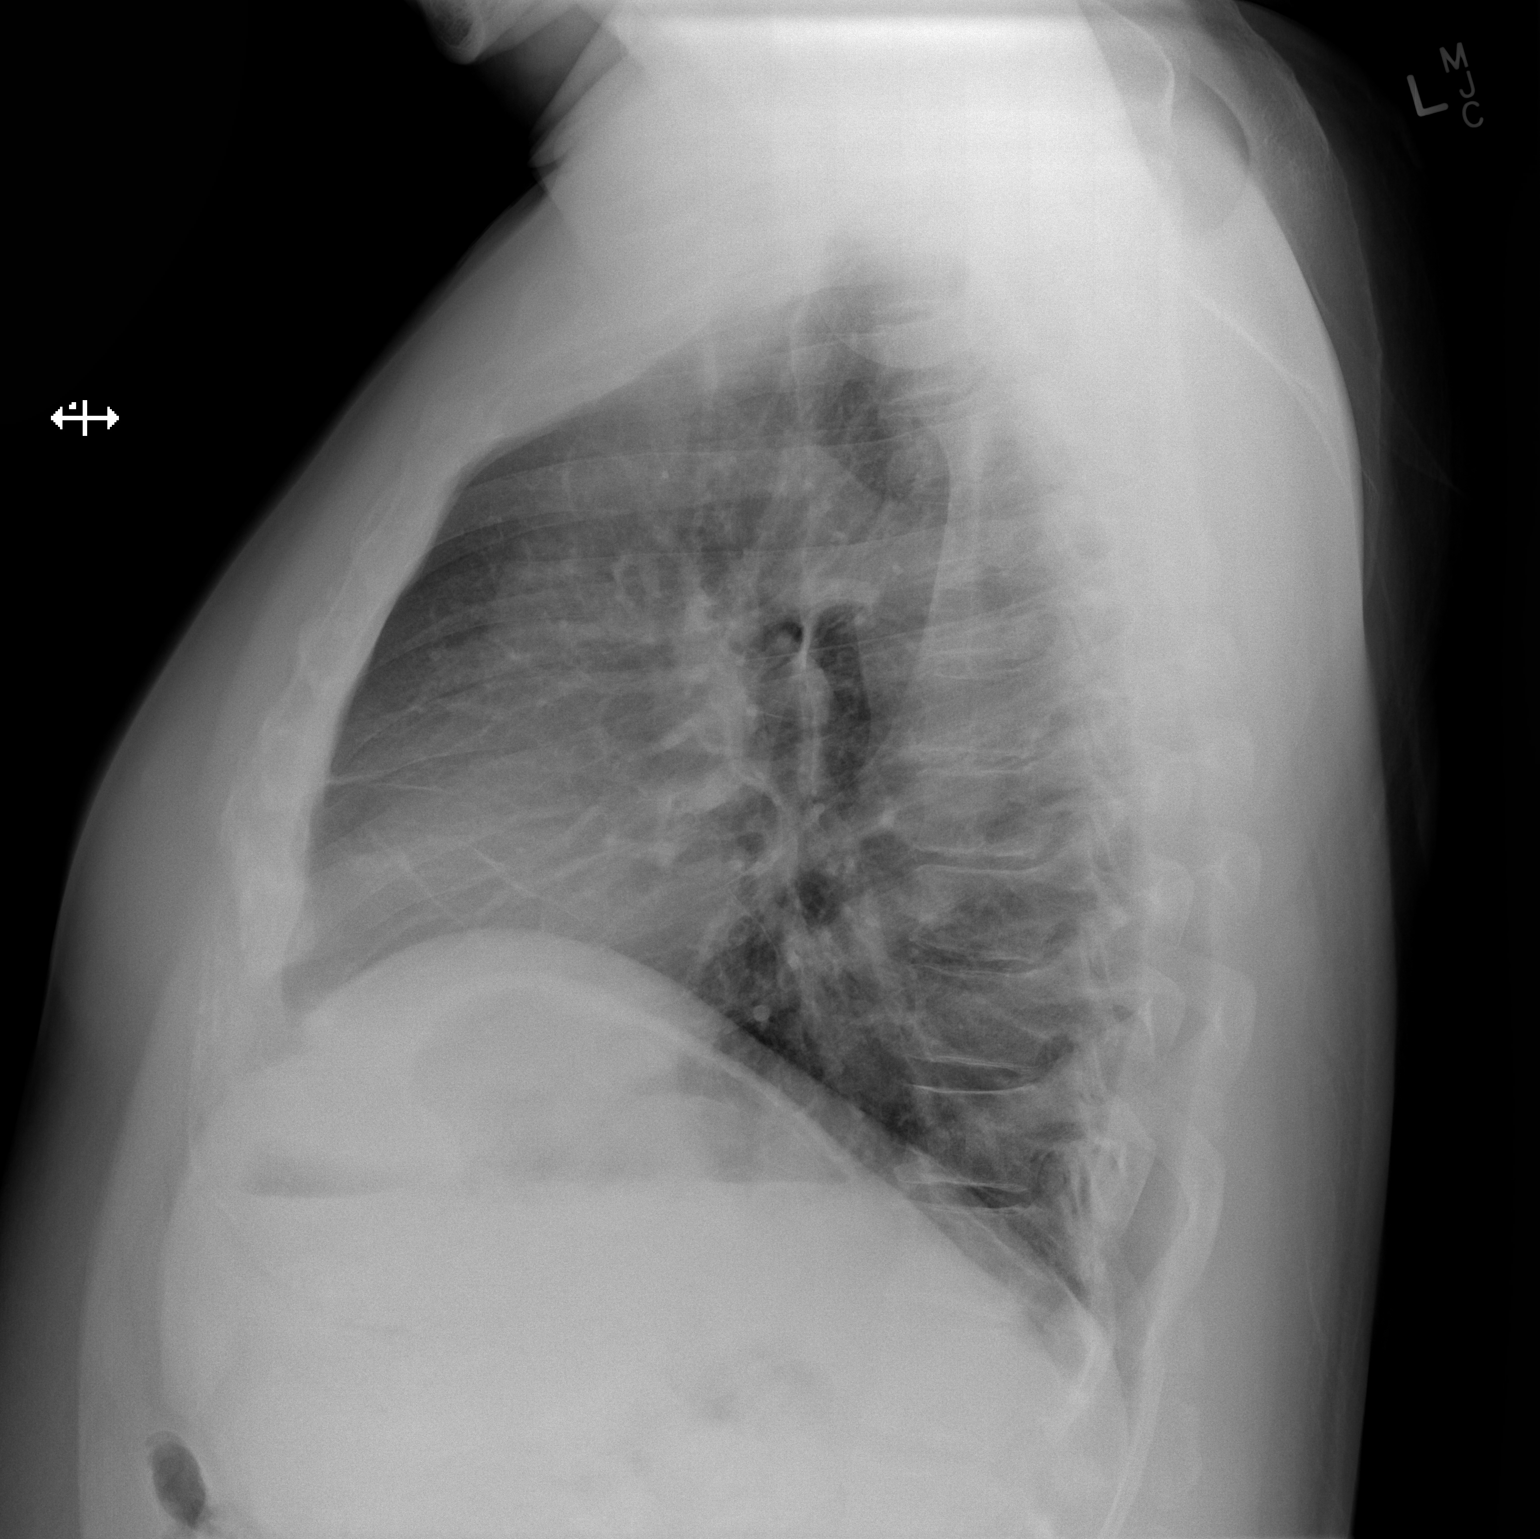

[2 of 2 positions shown; findings below may reference images not displayed]

FINDINGS: Normal cardiac silhouette. Low lung volumes with bibasilar
atelectasis. No effusion, infiltrate, or pneumothorax.
IMPRESSION: Mild basilar atelectasis.  No edema or infiltrate.

## 2018-09-04 ENCOUNTER — Emergency Department
Admission: EM | Admit: 2018-09-04 | Discharge: 2018-09-04 | Disposition: A | Discharge status: Home / Self Care | Payer: 59 | Attending: Emergency Medicine | Admitting: Emergency Medicine

## 2018-09-04 DIAGNOSIS — R51 Headache: Secondary | ICD-10-CM | POA: Insufficient documentation

## 2018-09-04 DIAGNOSIS — I10 Essential (primary) hypertension: Secondary | ICD-10-CM | POA: Insufficient documentation

## 2018-09-04 DIAGNOSIS — Z760 Encounter for issue of repeat prescription: Secondary | ICD-10-CM

## 2018-09-04 DIAGNOSIS — Z794 Long term (current) use of insulin: Secondary | ICD-10-CM | POA: Insufficient documentation

## 2018-09-04 DIAGNOSIS — E119 Type 2 diabetes mellitus without complications: Secondary | ICD-10-CM | POA: Insufficient documentation

## 2018-09-04 MED ORDER — INSULIN REGULAR HUMAN 100 UNIT/ML IJ SOLN
12.00 [IU] | Freq: Three times a day (TID) | INTRAMUSCULAR | 0 refills | Status: AC
Start: 2018-09-04 — End: 2018-10-04

## 2018-09-04 MED ORDER — BLOOD GLUCOSE MONITOR WITH STRIPS (GENERIC)
PACK | 0 refills | Status: DC
Start: 2018-09-04 — End: 2018-09-04

## 2018-09-04 MED ORDER — INSULIN PEN NEEDLE 29G X 8MM MISC
0 refills | Status: AC
Start: 2018-09-04 — End: ?

## 2018-09-04 MED ORDER — INSULIN GLARGINE 100 UNIT/ML SC SOLN
28.00 [IU] | Freq: Every day | SUBCUTANEOUS | 0 refills | Status: AC
Start: 2018-09-04 — End: ?

## 2018-09-04 MED ORDER — BLOOD GLUCOSE MONITOR WITH STRIPS (GENERIC)
PACK | 0 refills | Status: AC
Start: 2018-09-04 — End: ?

## 2018-09-04 MED ORDER — LISINOPRIL 20 MG PO TABS
20.00 mg | ORAL_TABLET | Freq: Every day | ORAL | 0 refills | Status: AC
Start: 2018-09-04 — End: ?

## 2018-09-04 MED ORDER — TAMSULOSIN HCL 0.4 MG PO CAPS
0.40 mg | ORAL_CAPSULE | Freq: Every day | ORAL | 0 refills | Status: AC
Start: 2018-09-04 — End: 2018-10-04

## 2018-09-04 MED ORDER — NAPROXEN 500 MG PO TABS
500.00 mg | ORAL_TABLET | Freq: Two times a day (BID) | ORAL | 0 refills | Status: AC | PRN
Start: 2018-09-04 — End: ?

## 2018-09-04 MED ORDER — ALCOHOL SWABS 70 % PADS
MEDICATED_PAD | 0 refills | Status: AC
Start: 2018-09-04 — End: ?

## 2018-09-04 NOTE — Discharge Instructions (Signed)
Medication Refill     You have been given a refill of one or more of your regular medicines.     In emergency situations, the doctor can give you a temporary refill for the medicines you need. However, you need to follow up with your regular doctor for future refills.     Some medicines can cause significant symptoms when stopped suddenly. Therefore, it is important to make plans for getting refills before you run out of medicines.     Return here or go to the nearest Emergency Department if:  · You need an emergency refill on your medications and you cannot get in touch with your doctor.

## 2018-09-04 NOTE — EDIE (Signed)
COLLECTIVE?NOTIFICATION?09/04/2018 09:09?Trevor Jensen, Trevor Jensen?MRN: [id number]    Criteria Met      3 Different Facilities in 90 Days    5 ED Visits in 12 Months    Security and Safety  No recent Security Events currently on file    ED Care Guidelines  There are currently no ED Care Guidelines for this patient. Please check your facility's medical records system.        Prescription Monitoring Program  000??- Narcotic Use Score  000??- Sedative Use Score  000??- Stimulant Use Score  000??- Overdose Risk Score  - All Scores range from 000-999 with 75% of the population scoring < 200 and on 1% scoring above 650  - The last digit of the narcotic, sedative, and stimulant score indicates the number of active prescriptions of that type  - Higher Use scores correlate with increased prescribers, pharmacies, mg equiv, and overlapping prescriptions  - Higher Overdose Risk Scores correlate with increased risk of unintentional overdose death   Concerning or unexpectedly high scores should prompt a review of the PMP record; this does not constitute checking PMP for prescribing purposes.      E.D. Visit Count (12 mo.)  Facility Visits   [facility] 4   [facility] 1   [facility] 1   Total 6   Note: Visits indicate total known visits.      Recent Emergency Department Visit Summary  Date Facility [facility] Type Diagnoses or Chief Complaint   Sep 04, 2018 [place] [place]      Triage A      Medication Refill      Headache      Aug 11, 2018 [facility] [place] [place]      ABDOMINAL PAIN      Left upper quadrant pain      Hyperglycemia, unspecified      2. Vomiting, unspecified      3. Diarrhea, unspecified      5. Nonspecific elevation of levels of transaminase and lactic ac      6. Type 2 diabetes mellitus with hyperglycemia      7. Essential (primary) hypertension      8. Other long term (current) drug therapy      9. Underdosing of  angiotensin-converting-enzyme inhibitors, init      Jul 23, 2018 [facility] [employer]      Illness, unspecified      Cellulitis of right upper limb      Unspecified fracture of fourth metacarpal bone, right hand, i      Jul 09, 2018 [facility] [place] [place]      HAND PAIN      Other psychoactive substance abuse, uncomplicated      Type 2 diabetes mellitus without complications      Unspecified fracture of right wrist and hand, initial encount      Long term (current) use of insulin      Essential (primary) hypertension      Other injury of unspecified body region, initial encounter      1. Pain in right hand      2. Other specified soft tissue disorders      4. Unspecified open wound of right hand, initial encounter      Jul 04, 2018 [facility]. [place] [place] [facility]      HAND PAIN  Alcohol abuse, uncomplicated      Hyperglycemia, unspecified      Laceration without foreign body of right hand, initial encoun      Jul 01, 2018 [facility] [place] [place] [facility]      ISOLATION INFECTIOUS DISEASE      3. Other lesions of oral mucosa      4. Cough      5. Acute upper respiratory infection, unspecified      7. Procedure and treatment not carried out due to patient leavin      Sep 18, 2017 [facility] [place] [place] [facility]      Unspecified injury of thorax, initial encounter      Injury, unspecified, initial encounter          Recent Inpatient Visit Summary  Date Facility [facility] Type Diagnoses or Chief Complaint   Jul 04, 2018 [facility]. [place]      CELLULITIS      Hyperglycemia, unspecified      2. Cellulitis of right upper limb      3. Cutaneous abscess of right hand      4. Chronic obstructive pulmonary disease with (acute) exacerbati      5. Type 2 diabetes mellitus with hyperglycemia      6. Laceration without foreign body of right hand, initial encoun      7. Alcohol abuse,  uncomplicated      8. Essential (primary) hypertension      9. Post-traumatic stress disorder, unspecified          Care Team  Provider Specialty Phone Fax Service Dates   Deanne Coffer Case Manager/Care Coordinator [phone]  Current      Collective Portal  This patient has registered at the [facility] Emergency Department   For more information visit: [email or web address]     PLEASE NOTE:     1.   Any care recommendations and other clinical information are provided as guidelines or for historical purposes only, and providers should exercise their own clinical judgment when providing care.    2.   You may only use this information for purposes of treatment, payment or health care operations activities, and subject to the limitations of applicable Collective Policies.    3.   You should consult directly with the organization that provided a care guideline or other clinical history with any questions about additional information or accuracy or completeness of information provided.    ? 2020 [place], [employer] - [email or web address]

## 2018-09-04 NOTE — ED Provider Notes (Signed)
[facility] EMERGENCY DEPARTMENT H&P         CLINICAL INFORMATION        HPI:      Chief Complaint: Medication Refill and Headache  .    Trevor Jensen is a 53 y.o. male who presents with request for med refill.  [place] out [place] newly resident [facility] requesting multiple meds including insulin Flomax lisinopril.  No issues to be evaluated today.    History obtained from: patient, family, review of prior chart          ROS:      Positive and negative ROS elements as per HPI.  All other systems reviewed and negative.      Physical Exam:      Pulse 93   BP (!) 147/106   Resp 17   SpO2 97 %   Temp 98.2 F (36.8 C)    Physical Exam   Constitutional: He is oriented to person, place, and time. He appears well-developed and well-nourished.   HENT:   Head: Normocephalic and atraumatic.   Mouth/Throat: Oropharynx is clear and moist.   Eyes: Pupils are equal, round, and reactive to light. Conjunctivae and EOM are normal.   Neck: Normal range of motion. Neck supple.   Cardiovascular: Normal rate, regular rhythm and normal heart sounds.   Pulmonary/Chest: Effort normal and breath sounds normal.   Abdominal: Soft. Bowel sounds are normal.   Musculoskeletal: Normal range of motion.         General: No edema (no palpable cords).   Neurological: He is alert and oriented to person, place, and time. He has normal reflexes.   Skin: Skin is warm and dry.   Psychiatric: He has a normal mood and affect. Thought content normal.                PAST HISTORY        Primary Care Provider: Pcp, None, MD        PMH/PSH:    .     Past Medical History:   Diagnosis Date    Depression     Diabetes mellitus     Hypertension        He has no past surgical history on file.      Social/Family History:      He has no history on file for tobacco, alcohol, and drug.    No family history on file.      Listed Medications on Arrival:    .     Home Medications     None on File          Allergies: He has No Known Allergies.            VISIT INFORMATION        Clinical Course in the ED:            Medications Given in the ED:    .     ED Medication Orders (From admission, onward)    None            Procedures:      Procedures      Interpretations:                     RESULTS        Lab Results:      Results     ** No results found for the last 24 hours. **  Radiology Results:      No orders to display              Visit date: 09/04/2018      CLINICAL SUMMARY          Diagnosis:    .     Final diagnoses:   Medication refill         MDM Notes:      I am the first provider for this patient.  I reviewed the vital signs, nursing notes, past medical history, past surgical history, family history and social history.  I have reviewed the patient's previous charts.             Disposition:         Discharge               Discharge Prescriptions     Medication Sig Dispense Auth. Provider    lisinopril (ZESTRIL) 20 MG tablet Take 1 tablet (20 mg total) by mouth daily 30 tablet Alastor Kneale, Sheria Lang, MD    insulin glargine (LANTUS) 100 UNIT/ML injection Inject 28 Units into the skin daily 1 pen Duanne Duchesne, Sheria Lang, MD    insulin regular (HumuLIN R) 100 UNIT/ML injection Inject 12 Units into the skin 3 (three) times daily before meals Patient requests pen with needle heads 10.8 mL Samuel Bouche, MD    tamsulosin (FLOMAX) 0.4 MG Cap Take 1 capsule (0.4 mg total) by mouth daily Discontinue for lightheadedness 30 capsule Samuel Bouche, MD    Alcohol Swabs 70 % Pads For insulin injections 100 each Kenwood Rosiak, Sheria Lang, MD    Blood Glucose Monitor, generic, Kit  (Status: Discontinued) As directed 1 kit Jaeshawn Silvio, Sheria Lang, MD    naproxen (NAPROSYN) 500 MG tablet Take 1 tablet (500 mg total) by mouth 2 (two) times daily as needed (pain) 20 tablet Treazure Nery, Sheria Lang, MD    Insulin Pen Needle 29G X Misc 1 box stock 1 each Gracee Ratterree, Sheria Lang, MD    Blood Glucose Monitor, generic, Kit As directed 1 kit Mikah Poss,  Sheria Lang, MD                     Scribe Attestation:      No scribe involved in the care of this patient          Samuel Bouche, MD  09/19/18 [phone]
